# Patient Record
Sex: Female | Born: 1970 | Race: Black or African American | Hispanic: No | Marital: Single | Smoking: Never smoker
Health system: Southern US, Community
[De-identification: ages and names within clinical notes are randomized; demographics above are authoritative.]

## PROBLEM LIST (undated history)

## (undated) DIAGNOSIS — E119 Type 2 diabetes mellitus without complications: Secondary | ICD-10-CM

## (undated) DIAGNOSIS — K219 Gastro-esophageal reflux disease without esophagitis: Secondary | ICD-10-CM

## (undated) DIAGNOSIS — I10 Essential (primary) hypertension: Secondary | ICD-10-CM

## (undated) HISTORY — PX: CHOLECYSTECTOMY: SHX55

## (undated) HISTORY — PX: BREAST BIOPSY: SHX20

## (undated) HISTORY — PX: REDUCTION MAMMAPLASTY: SUR839

## (undated) HISTORY — DX: Essential (primary) hypertension: I10

## (undated) HISTORY — DX: Gastro-esophageal reflux disease without esophagitis: K21.9

## (undated) HISTORY — PX: BREAST EXCISIONAL BIOPSY: SUR124

---

## 1997-07-05 ENCOUNTER — Encounter: Admission: RE | Admit: 1997-07-05 | Discharge: 1997-10-03 | Payer: Self-pay | Admitting: Internal Medicine

## 1998-06-02 ENCOUNTER — Emergency Department (HOSPITAL_COMMUNITY): Admission: EM | Admit: 1998-06-02 | Discharge: 1998-06-02 | Payer: Self-pay | Admitting: Emergency Medicine

## 1999-01-15 HISTORY — PX: BREAST SURGERY: SHX581

## 1999-04-19 ENCOUNTER — Encounter: Admission: RE | Admit: 1999-04-19 | Discharge: 1999-04-19 | Payer: Self-pay | Admitting: Family Medicine

## 1999-04-19 ENCOUNTER — Encounter: Payer: Self-pay | Admitting: Family Medicine

## 1999-07-05 ENCOUNTER — Encounter: Admission: RE | Admit: 1999-07-05 | Discharge: 1999-07-05 | Payer: Self-pay | Admitting: Family Medicine

## 1999-07-05 ENCOUNTER — Encounter: Payer: Self-pay | Admitting: Family Medicine

## 1999-08-02 ENCOUNTER — Other Ambulatory Visit: Admission: RE | Admit: 1999-08-02 | Discharge: 1999-08-02 | Payer: Self-pay | Admitting: Family Medicine

## 2000-02-12 ENCOUNTER — Encounter: Admission: RE | Admit: 2000-02-12 | Discharge: 2000-03-03 | Payer: Self-pay | Admitting: Orthopedic Surgery

## 2000-04-01 ENCOUNTER — Encounter (INDEPENDENT_AMBULATORY_CARE_PROVIDER_SITE_OTHER): Payer: Self-pay | Admitting: Specialist

## 2000-04-01 ENCOUNTER — Other Ambulatory Visit: Admission: RE | Admit: 2000-04-01 | Discharge: 2000-04-01 | Payer: Self-pay | Admitting: Plastic Surgery

## 2001-02-11 ENCOUNTER — Ambulatory Visit (HOSPITAL_BASED_OUTPATIENT_CLINIC_OR_DEPARTMENT_OTHER): Admission: RE | Admit: 2001-02-11 | Discharge: 2001-02-11 | Payer: Self-pay | Admitting: Plastic Surgery

## 2003-09-26 ENCOUNTER — Other Ambulatory Visit: Admission: RE | Admit: 2003-09-26 | Discharge: 2003-09-26 | Payer: Self-pay | Admitting: Family Medicine

## 2004-09-26 ENCOUNTER — Other Ambulatory Visit: Admission: RE | Admit: 2004-09-26 | Discharge: 2004-09-26 | Payer: Self-pay | Admitting: Family Medicine

## 2005-01-01 ENCOUNTER — Encounter: Admission: RE | Admit: 2005-01-01 | Discharge: 2005-04-01 | Payer: Self-pay | Admitting: Family Medicine

## 2005-08-02 ENCOUNTER — Emergency Department (HOSPITAL_COMMUNITY): Admission: EM | Admit: 2005-08-02 | Discharge: 2005-08-02 | Payer: Self-pay | Admitting: Family Medicine

## 2006-01-14 HISTORY — PX: LAPAROSCOPIC GASTRIC BANDING: SHX1100

## 2006-08-12 ENCOUNTER — Encounter: Admission: RE | Admit: 2006-08-12 | Discharge: 2006-08-12 | Payer: Self-pay | Admitting: Family Medicine

## 2006-08-15 ENCOUNTER — Encounter: Admission: RE | Admit: 2006-08-15 | Discharge: 2006-08-15 | Payer: Self-pay | Admitting: Family Medicine

## 2006-08-26 ENCOUNTER — Encounter (INDEPENDENT_AMBULATORY_CARE_PROVIDER_SITE_OTHER): Payer: Self-pay | Admitting: Diagnostic Radiology

## 2006-08-26 ENCOUNTER — Encounter: Admission: RE | Admit: 2006-08-26 | Discharge: 2006-08-26 | Payer: Self-pay | Admitting: Family Medicine

## 2006-09-29 ENCOUNTER — Encounter (INDEPENDENT_AMBULATORY_CARE_PROVIDER_SITE_OTHER): Payer: Self-pay | Admitting: Surgery

## 2006-09-29 ENCOUNTER — Ambulatory Visit (HOSPITAL_BASED_OUTPATIENT_CLINIC_OR_DEPARTMENT_OTHER): Admission: RE | Admit: 2006-09-29 | Discharge: 2006-09-29 | Payer: Self-pay | Admitting: Surgery

## 2006-09-29 ENCOUNTER — Encounter: Admission: RE | Admit: 2006-09-29 | Discharge: 2006-09-29 | Payer: Self-pay | Admitting: Surgery

## 2007-05-28 ENCOUNTER — Other Ambulatory Visit: Admission: RE | Admit: 2007-05-28 | Discharge: 2007-05-28 | Payer: Self-pay | Admitting: Family Medicine

## 2007-08-06 ENCOUNTER — Encounter: Admission: RE | Admit: 2007-08-06 | Discharge: 2007-08-06 | Payer: Self-pay | Admitting: Family Medicine

## 2007-09-20 ENCOUNTER — Emergency Department (HOSPITAL_COMMUNITY): Admission: EM | Admit: 2007-09-20 | Discharge: 2007-09-20 | Payer: Self-pay | Admitting: Emergency Medicine

## 2008-08-11 ENCOUNTER — Encounter: Admission: RE | Admit: 2008-08-11 | Discharge: 2008-08-11 | Payer: Self-pay | Admitting: Family Medicine

## 2008-08-17 ENCOUNTER — Encounter: Admission: RE | Admit: 2008-08-17 | Discharge: 2008-08-17 | Payer: Self-pay | Admitting: Family Medicine

## 2008-08-25 ENCOUNTER — Emergency Department (HOSPITAL_COMMUNITY): Admission: EM | Admit: 2008-08-25 | Discharge: 2008-08-25 | Payer: Self-pay | Admitting: Emergency Medicine

## 2008-09-07 ENCOUNTER — Other Ambulatory Visit: Admission: RE | Admit: 2008-09-07 | Discharge: 2008-09-07 | Payer: Self-pay | Admitting: Family Medicine

## 2009-10-25 ENCOUNTER — Encounter: Admission: RE | Admit: 2009-10-25 | Discharge: 2009-10-25 | Payer: Self-pay | Admitting: Family Medicine

## 2009-12-27 ENCOUNTER — Other Ambulatory Visit
Admission: RE | Admit: 2009-12-27 | Discharge: 2009-12-27 | Payer: Self-pay | Source: Home / Self Care | Admitting: Family Medicine

## 2010-02-05 ENCOUNTER — Encounter: Payer: Self-pay | Admitting: Family Medicine

## 2010-05-29 NOTE — Op Note (Signed)
NAMEKOSHA, JAQUITH            ACCOUNT NO.:  [id number]   MEDICAL RECORD NO.:  [id number]          PATIENT TYPE:  AMB   LOCATION:  DSC                          FACILITY:  [facility]   PHYSICIAN:  Currie Paris, M.D.DATE OF BIRTH:  March 11, 1970   DATE OF PROCEDURE:  09/29/2006  DATE OF DISCHARGE:                               OPERATIVE REPORT   Office record #[id number]   PREOPERATIVE DIAGNOSIS:  Mass, left breast.   POSTOPERATIVE DIAGNOSIS:  Mass, left breast.   OPERATION:  Needle-guided left breast biopsy.   SURGEON:  Currie Paris, M.D.   ANESTHESIA:  General.   CLINICAL HISTORY:  This is a 40 year old lady who is status post  reduction mammoplasties whose recent mammogram showed an abnormality and  MRI guided biopsy was indeterminate and, therefore, excisional biopsy  was recommended.   DESCRIPTION OF PROCEDURE:  The patient was seen in the holding area.  She had no further questions.  We confirmed and marked the left breast  as the operative side.  I reviewed the needle localized mammograms and  the guidewire appeared to go beyond the area in question starting  medially and working laterally almost in a direct horizontal plane.   The patient taken to the operating room and after satisfactory general  anesthesia had been obtained, the left breast was prepped and draped.  The time-out was done.   I made a straight incision starting at the guidewire entry site which  was also the site apparently of her prior biopsy and took a small button  of skin right there and then extended the incision towards the nipple.  I went approximately 6 cm so that I would be beyond the area of a  nodular density at least by the mammogram appearance.  I then raised a  superior flap and then went down to the chest wall.  Then worked around  medially just adjacent to the guidewire so that I was down to the chest  wall medial and I thought that I would then be well medial to the area  in  question, raised inferior flap down again into the chest wall, then  with these three sides freed up and came under, I was able to grasp this  tissue with an Allis, pulled it up into the wound and then excised it on  the nipple side.  As I was  coming down very deep, I found the guidewire  was apparently fully beyond the area in question and I could feel a  small nodular density in the deep superior lateral aspect of the  specimen.  The guidewire as I was pulling on it pulled through since it  was anchored beyond this area.   I sent this for specimen mammography and this subsequently returned and  we appeared to have the area in question.  However, I took some  additional tissue from the superior lateral deep margin to be sure that  we were well around the area that I could feel it was palpably abnormal.  Basically the deep margin was the chest wall.   I raised some deep skin flaps  to make sure I could close a little bit of  the deep breast.  I infiltrated 0.25% plain Marcaine to see if we could  help with postop pain relief.  I then irrigated and carefully achieved  hemostasis with a combination of cautery and suture ligatures of 3-0  Vicryl.   The breast was then closed with 3-0 Vicryl followed by 4-0 Monocryl  subcuticular and Dermabond.   The patient tolerated procedure well and there no operative  complications.  All counts were correct.      Currie Paris, M.D.  Electronically Signed     CJS/MEDQ  D:  09/29/2006  T:  09/30/2006  Job:  [id number]   cc:   Dario Guardian, M.D.

## 2010-06-01 NOTE — Op Note (Signed)
[place]  Patient:    Lauren Hale, Lauren Hale Visit Number: [id number] MRN: [id number]          Service Type: DSU Location: [facility] Attending Physician:  Loura Halt Ii Dictated by:   Alfredia Ferguson, M.D. Proc. Date: 02/11/01 Admit Date:  02/11/2001                             Operative Report  PREOPERATIVE DIAGNOSIS:  Bilateral axillary "dog ears."  POSTOPERATIVE DIAGNOSIS:  Bilateral axillary "dog ears."  OPERATION PERFORMED:  Excision of bilateral axillary dog ears with primary closure.  SURGEON:  Alfredia Ferguson, M.D.  ANESTHESIA:  General endotracheal anesthesia.  INDICATIONS FOR PROCEDURE:  The patient is a 40 year old woman who is status post bilateral breast reduction.  She has been left with bilateral axillary dog ears that she wishes to have removed.  The patient is quite overweight but still the dog ears are bothersome to the inner aspect of her arm when she adducts her arm.  She understands the risk of infection, bleeding, hematoma, unsightly scarring and inability to get rid of all of the dog ears.  In spite of that, she wishes to proceed with surgery.  DESCRIPTION OF PROCEDURE:  With the patient in a standing position, skin markers were placed around each of the two dog ears.  The patient was then placed upon the operating table where she was given general endotracheal anesthesia.  The patients bilateral axillary region was prepped and draped in sterile fashion.  Approximately 15 cc of Marcaine 0.5% 1:200,000 epinephrine was infiltrated in the left side followed by 15% on the right side dog ear. After waiting approximately 5 minutes, attention was first directed to the left side where an elliptical excision of the dog ear was carried out.  The dog ear measured approximately 8 cm in length and approximately 4 to 5 cm in width.  This dog ear was excised, then hemostasis accomplished using electrocautery.  The wound was  copiously irrigated with saline irrigation. After ensuring hemostasis, the wound was closed using multiple interrupted 3-0 Vicryl suture for the dermis followed by a running 3-0 Vicryl subcuticular. Attention was directed to the right side where an identical procedure was performed.  The dimensions of the dog ear on the right was approximately 6 cm x 3 cm.  Upon excision and closure of the wound, Steri-Strips were applied along the skin closure followed by a bulky dressing in the axilla with a 6 inch Ace wrap placed circumferentially around the chest.  The patient tolerated the procedure well.  She had an estimated blood loss of less than 10 cc.  She was awakened, extubated and transported to recovery room in satisfactory condition. Dictated by:   Alfredia Ferguson, M.D. Attending Physician:  Loura Halt Ii DD:  02/11/01 TD:  02/11/01 Job: [id number]

## 2010-06-01 NOTE — Consult Note (Signed)
[facility]  Patient:    Lauren Hale, Lauren Hale                   MRN: [id number] Proc. Date: 02/12/00 Adm. Date:  [id number] Attending:  Nadara Mustard CC:         Dario Guardian, M.D.   Consultation Report  HISTORY OF PRESENT ILLNESS:  The patient is a 40 year old woman with type 2 diabetes and hyperpigmented plane of skin over her midfoot.  The patient presents at this time for evaluation of her diabetic neuropathy and evaluation of her feet.  Diagnosis with type 2 diabetes 3 to 4 years ago.  Most recent hemoglobin A1C 6.2 in July 2001.  PRIMARY CARE PHYSICIAN:  Dr. Merri Brunette.  Height 5 feet 3 inches, weight 230 pounds.  PAST MEDICAL DIAGNOSES: 1. Diabetes. 2. Reflux. 3. Hiatal hernia.  PAST SURGICAL HISTORY: 1. Cholecystectomy. 2. Right leg surgery.  ALLERGIES:  No known drug allergies.  MEDICATIONS:  Amaryl 5 mg p.o. q.d.  PHYSICAL EXAMINATION:  Examination of her both lower extremities does show that she has hyperpigmented skin changes on the plantar aspect of both feet. She does have decreased hair growth.  Has good dorsalis pedis pulses.  Does have protective sensation, can feel 5.07 Sims ______ monofilament.  She has clawing of her toes with prominent metatarsal heads bilaterally with a cavus foot.  There is no bunion deformities.  ASSESSMENT:  Diabetic insensate neuropathy with claw toes and prominent metatarsal head with callus formation and hyperpigmented skin.  PLAN:  We will go ahead and set her up for extra depth shoes, custom inserts, follow up as needed. DD:  02/19/00 TD:  02/20/00 Job: [id number]

## 2010-09-25 ENCOUNTER — Other Ambulatory Visit: Payer: Self-pay | Admitting: Family Medicine

## 2010-09-25 DIAGNOSIS — Z1231 Encounter for screening mammogram for malignant neoplasm of breast: Secondary | ICD-10-CM

## 2010-10-25 LAB — POCT HEMOGLOBIN-HEMACUE: Operator id: 116011

## 2010-10-29 ENCOUNTER — Ambulatory Visit
Admission: RE | Admit: 2010-10-29 | Discharge: 2010-10-29 | Disposition: A | Discharge status: Home / Self Care | Payer: Federal, State, Local not specified - PPO | Source: Ambulatory Visit | Attending: Family Medicine | Admitting: Family Medicine

## 2010-10-29 DIAGNOSIS — Z1231 Encounter for screening mammogram for malignant neoplasm of breast: Secondary | ICD-10-CM

## 2011-10-02 ENCOUNTER — Other Ambulatory Visit: Payer: Self-pay | Admitting: Family Medicine

## 2011-10-02 DIAGNOSIS — Z1231 Encounter for screening mammogram for malignant neoplasm of breast: Secondary | ICD-10-CM

## 2011-11-08 ENCOUNTER — Ambulatory Visit
Admission: RE | Admit: 2011-11-08 | Discharge: 2011-11-08 | Disposition: A | Discharge status: Home / Self Care | Payer: Federal, State, Local not specified - PPO | Source: Ambulatory Visit | Attending: Family Medicine | Admitting: Family Medicine

## 2011-11-08 DIAGNOSIS — Z1231 Encounter for screening mammogram for malignant neoplasm of breast: Secondary | ICD-10-CM

## 2012-01-03 ENCOUNTER — Encounter (HOSPITAL_COMMUNITY): Payer: Self-pay | Admitting: Pharmacist

## 2012-01-06 ENCOUNTER — Other Ambulatory Visit: Payer: Self-pay | Admitting: Obstetrics & Gynecology

## 2012-01-09 ENCOUNTER — Encounter (HOSPITAL_COMMUNITY): Payer: Self-pay

## 2012-01-09 ENCOUNTER — Encounter (HOSPITAL_COMMUNITY)
Admission: RE | Admit: 2012-01-09 | Discharge: 2012-01-09 | Disposition: A | Discharge status: Home / Self Care | Payer: Federal, State, Local not specified - PPO | Source: Ambulatory Visit | Attending: Obstetrics & Gynecology | Admitting: Obstetrics & Gynecology

## 2012-01-09 HISTORY — DX: Type 2 diabetes mellitus without complications: E11.9

## 2012-01-09 LAB — CBC
MCH: 26.8 pg (ref 26.0–34.0)
MCV: 83.1 fL (ref 78.0–100.0)
Platelets: 286 10*3/uL (ref 150–400)
RDW: 15.8 % — ABNORMAL HIGH (ref 11.5–15.5)
WBC: 12.9 10*3/uL — ABNORMAL HIGH (ref 4.0–10.5)

## 2012-01-09 LAB — BASIC METABOLIC PANEL
BUN: 8 mg/dL (ref 6–23)
CO2: 26 mEq/L (ref 19–32)
Chloride: 101 mEq/L (ref 96–112)
Glucose, Bld: 93 mg/dL (ref 70–99)
Potassium: 3.5 mEq/L (ref 3.5–5.1)

## 2012-01-09 LAB — SURGICAL PCR SCREEN: Staphylococcus aureus: NEGATIVE

## 2012-01-09 NOTE — Patient Instructions (Addendum)
[address]  01/09/2012   Your procedure is scheduled on:  01/17/12  Enter through the Main Entrance of [facility] at 1130 AM.  Pick up the phone at the desk and dial [phone].   Call this number if you have problems the morning of surgery: [phone]   Remember:   Do not eat food:After Midnight.  Do not drink clear liquids: 4 Hours before arrival.  Take these medicines the morning of surgery with A SIP OF WATER: NA   Do not wear jewelry, make-up or nail polish.  Do not wear lotions, powders, or perfumes. You may wear deodorant.  Do not shave 48 hours prior to surgery.  Do not bring valuables to the hospital.  Contacts, dentures or bridgework may not be worn into surgery.  Leave suitcase in the car. After surgery it may be brought to your room.  For patients admitted to the hospital, checkout time is 11:00 AM the day of discharge.   Patients discharged the day of surgery will not be allowed to drive home.  Name and phone number of your driver: NA  Special Instructions: Shower using CHG 2 nights before surgery and the night before surgery.  If you shower the day of surgery use CHG.  Use special wash - you have one bottle of CHG for all showers.  You should use approximately 1/3 of the bottle for each shower.   Please read over the following fact sheets that you were given: MRSA Information

## 2012-01-16 MED ORDER — DEXTROSE 5 % IV SOLN
2.0000 g | Freq: Once | INTRAVENOUS | Status: DC
  Filled 2012-01-16: qty 2

## 2012-01-17 ENCOUNTER — Encounter (HOSPITAL_COMMUNITY)
Admission: RE | Disposition: A | Discharge status: Home / Self Care | Payer: Self-pay | Source: Ambulatory Visit | Attending: Obstetrics & Gynecology

## 2012-01-17 ENCOUNTER — Ambulatory Visit (HOSPITAL_COMMUNITY)
Admission: RE | Admit: 2012-01-17 | Discharge: 2012-01-18 | Disposition: A | Discharge status: Home / Self Care | Payer: Federal, State, Local not specified - PPO | Source: Ambulatory Visit | Attending: Obstetrics & Gynecology | Admitting: Obstetrics & Gynecology

## 2012-01-17 ENCOUNTER — Encounter (HOSPITAL_COMMUNITY): Payer: Self-pay

## 2012-01-17 ENCOUNTER — Encounter (HOSPITAL_COMMUNITY): Payer: Self-pay | Admitting: *Deleted

## 2012-01-17 ENCOUNTER — Ambulatory Visit (HOSPITAL_COMMUNITY): Payer: Federal, State, Local not specified - PPO

## 2012-01-17 DIAGNOSIS — Z01812 Encounter for preprocedural laboratory examination: Secondary | ICD-10-CM | POA: Insufficient documentation

## 2012-01-17 DIAGNOSIS — N920 Excessive and frequent menstruation with regular cycle: Secondary | ICD-10-CM | POA: Insufficient documentation

## 2012-01-17 DIAGNOSIS — D259 Leiomyoma of uterus, unspecified: Secondary | ICD-10-CM | POA: Insufficient documentation

## 2012-01-17 DIAGNOSIS — Z01818 Encounter for other preprocedural examination: Secondary | ICD-10-CM | POA: Insufficient documentation

## 2012-01-17 DIAGNOSIS — Z9889 Other specified postprocedural states: Secondary | ICD-10-CM

## 2012-01-17 HISTORY — PX: ROBOT ASSISTED MYOMECTOMY: SHX5142

## 2012-01-17 LAB — CBC
HCT: 41.7 % (ref 36.0–46.0)
MCV: 83.6 fL (ref 78.0–100.0)
RBC: 4.99 MIL/uL (ref 3.87–5.11)
WBC: 12.1 10*3/uL — ABNORMAL HIGH (ref 4.0–10.5)

## 2012-01-17 LAB — ABO/RH: ABO/RH(D): O POS

## 2012-01-17 LAB — TYPE AND SCREEN
ABO/RH(D): O POS
Antibody Screen: NEGATIVE

## 2012-01-17 LAB — GLUCOSE, CAPILLARY: Glucose-Capillary: 195 mg/dL — ABNORMAL HIGH (ref 70–99)

## 2012-01-17 SURGERY — ROBOTIC ASSISTED MYOMECTOMY
Anesthesia: General | Wound class: Clean Contaminated

## 2012-01-17 MED ORDER — FENTANYL CITRATE 0.05 MG/ML IJ SOLN
INTRAMUSCULAR | Status: DC | PRN
  Administered 2012-01-17: 100 ug via INTRAVENOUS
  Administered 2012-01-17 (×2): 50 ug via INTRAVENOUS
  Administered 2012-01-17 (×3): 100 ug via INTRAVENOUS

## 2012-01-17 MED ORDER — BUPIVACAINE HCL (PF) 0.25 % IJ SOLN
INTRAMUSCULAR | Status: AC
  Filled 2012-01-17: qty 30

## 2012-01-17 MED ORDER — FENTANYL CITRATE 0.05 MG/ML IJ SOLN
INTRAMUSCULAR | Status: AC
  Filled 2012-01-17: qty 5

## 2012-01-17 MED ORDER — FENTANYL CITRATE 0.05 MG/ML IJ SOLN
25.0000 ug | INTRAMUSCULAR | Status: DC | PRN
  Administered 2012-01-17: 50 ug via INTRAVENOUS

## 2012-01-17 MED ORDER — FENTANYL CITRATE 0.05 MG/ML IJ SOLN
INTRAMUSCULAR | Status: AC
  Filled 2012-01-17: qty 2

## 2012-01-17 MED ORDER — VASOPRESSIN 20 UNIT/ML IJ SOLN
INTRAMUSCULAR | Status: AC
  Filled 2012-01-17: qty 2

## 2012-01-17 MED ORDER — GLYCOPYRROLATE 0.2 MG/ML IJ SOLN
INTRAMUSCULAR | Status: AC
  Filled 2012-01-17: qty 5

## 2012-01-17 MED ORDER — OXYCODONE-ACETAMINOPHEN 5-325 MG PO TABS
1.0000 | ORAL_TABLET | ORAL | Status: DC | PRN
  Administered 2012-01-18 (×2): 1 via ORAL
  Filled 2012-01-17: qty 1
  Filled 2012-01-17: qty 2

## 2012-01-17 MED ORDER — LIDOCAINE HCL (CARDIAC) 20 MG/ML IV SOLN
INTRAVENOUS | Status: AC
  Filled 2012-01-17: qty 5

## 2012-01-17 MED ORDER — NEOSTIGMINE METHYLSULFATE 1 MG/ML IJ SOLN
INTRAMUSCULAR | Status: AC
  Filled 2012-01-17: qty 1

## 2012-01-17 MED ORDER — MIDAZOLAM HCL 5 MG/5ML IJ SOLN
INTRAMUSCULAR | Status: DC | PRN
  Administered 2012-01-17: 2 mg via INTRAVENOUS

## 2012-01-17 MED ORDER — DEXAMETHASONE SODIUM PHOSPHATE 4 MG/ML IJ SOLN
INTRAMUSCULAR | Status: DC | PRN
  Administered 2012-01-17: 10 mg via INTRAVENOUS

## 2012-01-17 MED ORDER — ARTIFICIAL TEARS OP OINT
TOPICAL_OINTMENT | OPHTHALMIC | Status: AC
  Filled 2012-01-17: qty 3.5

## 2012-01-17 MED ORDER — MIDAZOLAM HCL 2 MG/2ML IJ SOLN
INTRAMUSCULAR | Status: AC
  Filled 2012-01-17: qty 2

## 2012-01-17 MED ORDER — LIDOCAINE HCL (CARDIAC) 20 MG/ML IV SOLN
INTRAVENOUS | Status: DC | PRN
  Administered 2012-01-17: 60 mg via INTRAVENOUS

## 2012-01-17 MED ORDER — HYDROMORPHONE HCL PF 1 MG/ML IJ SOLN
INTRAMUSCULAR | Status: AC
  Filled 2012-01-17: qty 1

## 2012-01-17 MED ORDER — LACTATED RINGERS IV SOLN
INTRAVENOUS | Status: DC
  Administered 2012-01-17 – 2012-01-18 (×2): via INTRAVENOUS

## 2012-01-17 MED ORDER — ROCURONIUM BROMIDE 100 MG/10ML IV SOLN
INTRAVENOUS | Status: DC | PRN
  Administered 2012-01-17: 10 mg via INTRAVENOUS
  Administered 2012-01-17: 20 mg via INTRAVENOUS
  Administered 2012-01-17: 50 mg via INTRAVENOUS
  Administered 2012-01-17: 20 mg via INTRAVENOUS

## 2012-01-17 MED ORDER — PHENYLEPHRINE 40 MCG/ML (10ML) SYRINGE FOR IV PUSH (FOR BLOOD PRESSURE SUPPORT)
PREFILLED_SYRINGE | INTRAVENOUS | Status: AC
  Filled 2012-01-17: qty 10

## 2012-01-17 MED ORDER — RINGERS IRRIGATION IR SOLN
Status: DC | PRN
  Administered 2012-01-17: 1

## 2012-01-17 MED ORDER — SCOPOLAMINE 1 MG/3DAYS TD PT72
MEDICATED_PATCH | TRANSDERMAL | Status: AC
  Administered 2012-01-17: 1.5 mg
  Filled 2012-01-17: qty 1

## 2012-01-17 MED ORDER — ONDANSETRON HCL 4 MG/2ML IJ SOLN
INTRAMUSCULAR | Status: AC
  Filled 2012-01-17: qty 2

## 2012-01-17 MED ORDER — MEPERIDINE HCL 25 MG/ML IJ SOLN: 6.2500 mg | INTRAMUSCULAR | Status: DC | PRN

## 2012-01-17 MED ORDER — PNEUMOCOCCAL VAC POLYVALENT 25 MCG/0.5ML IJ INJ
0.5000 mL | INJECTION | INTRAMUSCULAR | Status: AC
  Administered 2012-01-18: 0.5 mL via INTRAMUSCULAR
  Filled 2012-01-17: qty 0.5

## 2012-01-17 MED ORDER — PROPOFOL 10 MG/ML IV EMUL
INTRAVENOUS | Status: DC | PRN
  Administered 2012-01-17: 200 mg via INTRAVENOUS

## 2012-01-17 MED ORDER — BUPIVACAINE HCL (PF) 0.25 % IJ SOLN
INTRAMUSCULAR | Status: DC | PRN
  Administered 2012-01-17: 17 mL

## 2012-01-17 MED ORDER — NEOSTIGMINE METHYLSULFATE 1 MG/ML IJ SOLN
INTRAMUSCULAR | Status: DC | PRN
  Administered 2012-01-17: 5 mg via INTRAVENOUS

## 2012-01-17 MED ORDER — ROCURONIUM BROMIDE 50 MG/5ML IV SOLN
INTRAVENOUS | Status: AC
  Filled 2012-01-17: qty 1

## 2012-01-17 MED ORDER — PROPOFOL 10 MG/ML IV EMUL: INTRAVENOUS | Status: DC | PRN

## 2012-01-17 MED ORDER — HYDROMORPHONE HCL PF 1 MG/ML IJ SOLN
1.0000 mg | INTRAMUSCULAR | Status: DC | PRN
  Administered 2012-01-17: 1 mg via INTRAVENOUS
  Filled 2012-01-17: qty 1

## 2012-01-17 MED ORDER — VASOPRESSIN 20 UNIT/ML IJ SOLN
INTRAMUSCULAR | Status: DC | PRN
  Administered 2012-01-17: 40 [IU]

## 2012-01-17 MED ORDER — PROPOFOL 10 MG/ML IV EMUL
INTRAVENOUS | Status: AC
  Filled 2012-01-17: qty 20

## 2012-01-17 MED ORDER — GLYCOPYRROLATE 0.2 MG/ML IJ SOLN
INTRAMUSCULAR | Status: DC | PRN
  Administered 2012-01-17: 1 mg via INTRAVENOUS

## 2012-01-17 MED ORDER — LACTATED RINGERS IV SOLN
INTRAVENOUS | Status: DC
  Administered 2012-01-17: 12:00:00 via INTRAVENOUS

## 2012-01-17 MED ORDER — PHENYLEPHRINE HCL 10 MG/ML IJ SOLN
INTRAMUSCULAR | Status: DC | PRN
  Administered 2012-01-17 (×4): .08 mg via INTRAVENOUS
  Administered 2012-01-17: .04 mg via INTRAVENOUS

## 2012-01-17 MED ORDER — HYDROMORPHONE HCL PF 1 MG/ML IJ SOLN
INTRAMUSCULAR | Status: DC | PRN
  Administered 2012-01-17: 1 mg via INTRAVENOUS

## 2012-01-17 MED ORDER — ONDANSETRON HCL 4 MG/2ML IJ SOLN
INTRAMUSCULAR | Status: DC | PRN
  Administered 2012-01-17: 4 mg via INTRAVENOUS

## 2012-01-17 MED ORDER — LACTATED RINGERS IV SOLN
INTRAVENOUS | Status: DC | PRN
  Administered 2012-01-17 (×2): via INTRAVENOUS

## 2012-01-17 MED ORDER — METOCLOPRAMIDE HCL 5 MG/ML IJ SOLN: 10.0000 mg | Freq: Once | INTRAMUSCULAR | Status: DC | PRN

## 2012-01-17 MED ORDER — IBUPROFEN 600 MG PO TABS
600.0000 mg | ORAL_TABLET | Freq: Four times a day (QID) | ORAL | Status: DC | PRN
  Administered 2012-01-18: 600 mg via ORAL
  Filled 2012-01-17: qty 1

## 2012-01-17 MED ORDER — DEXTROSE 5 % IV SOLN
2.0000 g | INTRAVENOUS | Status: DC | PRN
  Administered 2012-01-17: 2 g via INTRAVENOUS

## 2012-01-17 SURGICAL SUPPLY — 63 items
BAG URINE DRAINAGE (UROLOGICAL SUPPLIES) ×2 IMPLANT
BARRIER ADHS 3X4 INTERCEED (GAUZE/BANDAGES/DRESSINGS) ×2 IMPLANT
BLADE LAP MORCELLATOR 15X9.5 (ELECTROSURGICAL) IMPLANT
BLADE LAPAROSCOPIC MORCELL KIT (BLADE) ×2 IMPLANT
CATH FOLEY 2WAY SLVR  5CC 14FR (CATHETERS) ×1
CATH FOLEY 2WAY SLVR 5CC 14FR (CATHETERS) ×1 IMPLANT
CATH FOLEY 3WAY  5CC 16FR (CATHETERS) ×1
CATH FOLEY 3WAY 5CC 16FR (CATHETERS) ×1 IMPLANT
CLOTH BEACON ORANGE TIMEOUT ST (SAFETY) ×2 IMPLANT
DECANTER SPIKE VIAL GLASS SM (MISCELLANEOUS) ×2 IMPLANT
DERMABOND ADVANCED (GAUZE/BANDAGES/DRESSINGS) ×2
DERMABOND ADVANCED .7 DNX12 (GAUZE/BANDAGES/DRESSINGS) ×2 IMPLANT
DRAPE HUG U DISPOSABLE (DRAPE) ×2 IMPLANT
DRAPE LG THREE QUARTER DISP (DRAPES) ×4 IMPLANT
DRAPE WARM FLUID 44X44 (DRAPE) ×2 IMPLANT
ELECT REM PT RETURN 9FT ADLT (ELECTROSURGICAL) ×2
ELECTRODE REM PT RTRN 9FT ADLT (ELECTROSURGICAL) ×1 IMPLANT
EVACUATOR SMOKE 8.L (FILTER) ×2 IMPLANT
GAUZE VASELINE 3X9 (GAUZE/BANDAGES/DRESSINGS) IMPLANT
GLOVE BIO SURGEON STRL SZ 6.5 (GLOVE) ×4 IMPLANT
GLOVE BIOGEL PI IND STRL 7.0 (GLOVE) ×1 IMPLANT
GLOVE BIOGEL PI INDICATOR 7.0 (GLOVE) ×1
GLOVE ECLIPSE 6.5 STRL STRAW (GLOVE) ×6 IMPLANT
GOWN STRL REIN XL XLG (GOWN DISPOSABLE) ×12 IMPLANT
IV SET ADMIN PUMP GEMINI W/NLD (IV SETS) IMPLANT
IV STOPCOCK 4 WAY 40  W/Y SET (IV SOLUTION) ×1
IV STOPCOCK 4 WAY 40 W/Y SET (IV SOLUTION) ×1 IMPLANT
KIT ACCESSORY DA VINCI DISP (KITS) ×1
KIT ACCESSORY DVNC DISP (KITS) ×1 IMPLANT
LEGGING LITHOTOMY PAIR STRL (DRAPES) ×2 IMPLANT
NEEDLE HYPO 22GX1.5 SAFETY (NEEDLE) ×2 IMPLANT
OCCLUDER COLPOPNEUMO (BALLOONS) IMPLANT
PACK LAVH (CUSTOM PROCEDURE TRAY) ×2 IMPLANT
PLUG CATH AND CAP STER (CATHETERS) ×2 IMPLANT
SET CYSTO W/LG BORE CLAMP LF (SET/KITS/TRAYS/PACK) IMPLANT
SET IRRIG TUBING LAPAROSCOPIC (IRRIGATION / IRRIGATOR) ×2 IMPLANT
SOLUTION ELECTROLUBE (MISCELLANEOUS) ×2 IMPLANT
SUT VIC AB 0 CT1 27 (SUTURE)
SUT VIC AB 0 CT1 27XBRD ANTBC (SUTURE) IMPLANT
SUT VIC AB 2-0 CT2 27 (SUTURE) ×2 IMPLANT
SUT VIC AB 4-0 PS2 27 (SUTURE) IMPLANT
SUT VICRYL 0 UR6 27IN ABS (SUTURE) ×4 IMPLANT
SUT VLOC 180 0 6IN GS21 (SUTURE) ×2 IMPLANT
SUT VLOC 180 0 9IN  GS21 (SUTURE) ×3
SUT VLOC 180 0 9IN GS21 (SUTURE) ×3 IMPLANT
SUT VLOC 180 2-0 6IN GS21 (SUTURE) ×4 IMPLANT
SUT VLOC 180 2-0 9IN GS21 (SUTURE) ×6 IMPLANT
SYR 50ML LL SCALE MARK (SYRINGE) ×2 IMPLANT
SYSTEM CONVERTIBLE TROCAR (TROCAR) ×2 IMPLANT
TIP UTERINE 5.1X6CM LAV DISP (MISCELLANEOUS) IMPLANT
TIP UTERINE 6.7X10CM GRN DISP (MISCELLANEOUS) IMPLANT
TIP UTERINE 6.7X6CM WHT DISP (MISCELLANEOUS) IMPLANT
TIP UTERINE 6.7X8CM BLUE DISP (MISCELLANEOUS) ×2 IMPLANT
TOWEL OR 17X24 6PK STRL BLUE (TOWEL DISPOSABLE) ×4 IMPLANT
TRAY FOLEY BAG SILVER LF 14FR (CATHETERS) ×2 IMPLANT
TROCAR 12M 150ML BLUNT (TROCAR) ×2 IMPLANT
TROCAR DISP BLADELESS 8 DVNC (TROCAR) ×1 IMPLANT
TROCAR DISP BLADELESS 8MM (TROCAR) ×1
TROCAR XCEL 12X100 BLDLESS (ENDOMECHANICALS) IMPLANT
TROCAR XCEL NON-BLD 5MMX100MML (ENDOMECHANICALS) ×4 IMPLANT
TROCAR Z-THREAD BLADED 12X100M (TROCAR) IMPLANT
TUBING FILTER THERMOFLATOR (ELECTROSURGICAL) ×2 IMPLANT
WATER STERILE IRR 1000ML POUR (IV SOLUTION) ×6 IMPLANT

## 2012-01-17 NOTE — Op Note (Signed)
01/17/2012  4:56 PM  PATIENT:  Lauren Hale  42 y.o. female  PRE-OPERATIVE DIAGNOSIS:  Menorrhagia; Myomas  POST-OPERATIVE DIAGNOSIS:  Menorrhagia; Myomas  PROCEDURE:  Procedure(s): ROBOTIC ASSISTED MYOMECTOMIES  SURGEON:  Surgeon(s): Genia Del, MD Lenoard Aden, MD  ASSISTANTS: Marlinda Mike   ANESTHESIA:   general  PROCEDURE:  Under general anesthesia with endotracheal intubation. The patient is in lithotomy position. She is prepped with ChloraPrep on the abdomen and with Betadine on the suprapubic, vulvar and vaginal areas.  She is draped as usual. A Foley is put in place in the bladder. The vaginal exam reveals an anteverted uterus, nodular. Adnexae are normal.  The weighted speculum is inserted in the vagina. The anterior lip of the cervix was grasped with a tenaculum. The hysterometry is 9 cm. We the #8 roomy with the medium Coe ring.  This is put in place easily.  Abdominally we infiltrate the subcutaneous tissue with Marcaine one quarter plain at the supraumbilical area.  We make a 1.5 cm incision with a scalpel.  We opened the aponeurosis with Mayo scissors under direct vision. We opened the parietal peritoneum with Metzenbaums scissors under direct vision. A pursestring stitch of Vicryl 0 is used at the aponeurosis. We insert the [place], create a pneumoperitoneum with CO2 and insert the camera.  The uterus presents multiple fibroids, the ovaries are normal to inspection as well as the tubes. No other lesions present in the pelvis. The appendix is normal to inspection. The liver appears normal as well. The catheter are from the lab band is seen. Pictures are taken before myomectomies and after.  We used a semicircular configuration for port placement.  For each port, we infiltrate with Marcaine, make an incision with a scalpel and insert the trocar and under direct vision.  2 robotic ports are placed on the right side, one robotic port on the lower left and the assistant  port a 5 mm trocar on the upper left.  The robot his docked from the right side. The instruments are inserted the Endo Shears scissor in the first arm, the PK on the second arm and the tenaculum on the third arm.  We go to the console.  We infiltrate the myometrium anteriorly with vasopressin.  We then make an incision at that level longitudinally with the tip of the Endo Shears scissor.  Using on the tenaculum and the fibroids the Endo Shears scissor and the PK, we completely extract a large anterior fundal fibroid.  Through the same incision we removed the lower are right anterior fibroid. As well as an upper left anterior fibroid. We then closed that incision with a V. LOC 0, 9 inches suture.  We do a running suture at the level of the myometrium.  We then infiltrates vasopressin posteriorly from the fundus to the the lower aspect of the uterus in the midline.  We make an incision at that level with the tip of the Endo Shears scissor.  We extract clot 3 fibroids are from that incision. We then closed that incision with a V. LOC 0, 9 inches in a running suture.  We then used a V. LOC 20 to close the serosa of both incisions in a baseball stitch.  We used a Vicryl 2-0 to close the smaller incision at the lower anterior right of the uterus.  Hemostasis was adequate at all levels. We therefore removed all robotic instruments. Undocked the robot. And went by laparoscopy. All needles were removed from the  abdomen, using the 8 mm camera. We then inserted on the morcellator through the supraumbilical incision under direct vision. All 6 myomas were morcellated and sent to pathology. We then irrigated the abdominopelvic cavities. Confirmed good hemostasis once more. We used Interceed on the incisions. All laparoscopy instruments were then removed. The CO2 was evacuated. The pursestring stitch at the supraumbilical incision was attached. Hemostasis was completed with the electrocautery when necessary on the incisions. A  Vicryl 4-0 was used in a subcuticular stitch at all incisions.Dermabond was added on all incisions. The instruments were removed from the vagina.  The patient was brought to recovery room in good and stable status.   ESTIMATED BLOOD LOSS: 100 cc   Intake/Output Summary (Last 24 hours) at 01/17/12 1656 Last data filed at 01/17/12 1641  Gross per 24 hour  Intake   1050 ml  Output      0 ml  Net   1050 ml     BLOOD ADMINISTERED:none   LOCAL MEDICATIONS USED:  MARCAINE     SPECIMEN:  Source of Specimen:  Uterine myomas (6)  DISPOSITION OF SPECIMEN:  PATHOLOGY  COUNTS:  YES  PLAN OF CARE: Transfer to PACU    Genia Del MD  01/17/2012  At 4:58 pm

## 2012-01-17 NOTE — Anesthesia Postprocedure Evaluation (Signed)
  Anesthesia Post-op Note  Patient: Lauren Hale  Procedure(s) Performed: Procedure(s) (LRB) with comments: ROBOTIC ASSISTED MYOMECTOMY (N/A)  Patient Location: PACU  Anesthesia Type:General  Level of Consciousness: awake, alert  and oriented  Airway and Oxygen Therapy: Patient Spontanous Breathing  Post-op Pain: none  Post-op Assessment: Post-op Vital signs reviewed, Patient's Cardiovascular Status Stable, Respiratory Function Stable, Patent Airway, No signs of Nausea or vomiting and Pain level controlled  Post-op Vital Signs: Reviewed and stable  Complications: No apparent anesthesia complications

## 2012-01-17 NOTE — H&P (Signed)
Lauren Hale is an 42 y.o. female G0 single  RP: Sxic myomas for [employer]  Pertinent Gynecological History: Menses: flow is excessive with use of many pads or tampons on heaviest days Contraception: Camila Blood transfusions: none Sexually transmitted diseases: no past history Previous GYN Procedures: none Last mammogram: normal Last pap: normal  OB History: G0   Menstrual History:  No LMP recorded.    Past Medical History  Diagnosis Date  . Diabetes mellitus without complication     diet control    Past Surgical History  Procedure Date  . Laparoscopic gastric banding 2008  . Cholecystectomy '90's  . Breast surgery 2001    reduction    No family history on file.  Social History:  reports that she has never smoked. She does not have any smokeless tobacco history on file. She reports that she drinks alcohol. She reports that she does not use illicit drugs.  Allergies: No Known Allergies  Prescriptions prior to admission  Medication Sig Dispense Refill  . fexofenadine (ALLEGRA) 60 MG tablet Take 60 mg by mouth 2 (two) times daily.      . mometasone (ELOCON) 0.1 % cream Apply 1 application topically daily.      . norethindrone (CAMILA) 0.35 MG tablet Take 1 tablet by mouth daily.      . Vitamin D, Ergocalciferol, (DRISDOL) 50000 UNITS CAPS Take 50,000 Units by mouth 2 (two) times a week.         Blood pressure 134/90, pulse 87, temperature 98.1 F (36.7 C), temperature source Oral, resp. rate 16, SpO2 100.00%.  Pelvic US:  4 myomas 2+ to 5+ cm.  Results for orders placed during the hospital encounter of 01/17/12 (from the past 24 hour(s))  PREGNANCY, URINE     Status: Normal   Collection Time   01/17/12 11:45 AM      Component Value Range   Preg Test, Ur NEGATIVE  NEGATIVE  CBC     Status: Abnormal   Collection Time   01/17/12 11:54 AM      Component Value Range   WBC 12.1 (*) 4.0 - 10.5 K/uL   RBC 4.99  3.87 - 5.11 MIL/uL   Hemoglobin 13.6   12.0 - 15.0 g/dL   HCT 14.7  82.9 - 56.2 %   MCV 83.6  78.0 - 100.0 fL   MCH 27.3  26.0 - 34.0 pg   MCHC 32.6  30.0 - 36.0 g/dL   RDW 13.0 (*) 86.5 - 78.4 %   Platelets 298  150 - 400 K/uL  TYPE AND SCREEN     Status: Normal (Preliminary result)   Collection Time   01/17/12 11:54 AM      Component Value Range   ABO/RH(D) O POS     Antibody Screen PENDING     Sample Expiration 01/20/2012    ABO/RH     Status: Normal   Collection Time   01/17/12 11:58 AM      Component Value Range   ABO/RH(D) O POS    GLUCOSE, CAPILLARY     Status: Normal   Collection Time   01/17/12 12:31 PM      Component Value Range   Glucose-Capillary 80  70 - 99 mg/dL    No results found.  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  Surg and risks reviewed.  Meelah Tallo,MARIE-LYNE 01/17/2012, 1:10 PM

## 2012-01-17 NOTE — Anesthesia Preprocedure Evaluation (Signed)
Anesthesia Evaluation  Patient identified by MRN, date of birth, ID band Patient awake    Reviewed: Allergy & Precautions, H&P , NPO status , Patient's Chart, lab work & pertinent test results  Airway Mallampati: III TM Distance: >3 FB Neck ROM: full    Dental No notable dental hx. (+) Teeth Intact   Pulmonary neg pulmonary ROS,  breath sounds clear to auscultation  Pulmonary exam normal       Cardiovascular negative cardio ROS  Rhythm:regular Rate:Normal     Neuro/Psych negative neurological ROS  negative psych ROS   GI/Hepatic negative GI ROS, Neg liver ROS,   Endo/Other  diabetes  Renal/GU negative Renal ROS  negative genitourinary   Musculoskeletal   Abdominal Normal abdominal exam  (+)   Peds  Hematology   Anesthesia Other Findings   Reproductive/Obstetrics negative OB ROS                           Anesthesia Physical Anesthesia Plan  ASA: II  Anesthesia Plan: General ETT   Post-op Pain Management:    Induction:   Airway Management Planned:   Additional Equipment:   Intra-op Plan:   Post-operative Plan:   Informed Consent: I have reviewed the patients History and Physical, chart, labs and discussed the procedure including the risks, benefits and alternatives for the proposed anesthesia with the patient or authorized representative who has indicated his/her understanding and acceptance.   Dental Advisory Given  Plan Discussed with: Anesthesiologist, CRNA and Surgeon  Anesthesia Plan Comments:         Anesthesia Quick Evaluation

## 2012-01-17 NOTE — Anesthesia Procedure Notes (Signed)
Procedure Name: Intubation Date/Time: 01/17/2012 1:27 PM Performed by: Shanon Payor Pre-anesthesia Checklist: Patient identified, Emergency Drugs available, Suction available, Patient being monitored and Timeout performed Patient Re-evaluated:Patient Re-evaluated prior to inductionOxygen Delivery Method: Circle system utilized Preoxygenation: Pre-oxygenation with 100% oxygen Intubation Type: IV induction Ventilation: Mask ventilation without difficulty Laryngoscope Size: Mac and 3 Grade View: Grade I Tube type: Oral Tube size: 7.0 mm Number of attempts: 1 Placement Confirmation: ETT inserted through vocal cords under direct vision,  positive ETCO2 and breath sounds checked- equal and bilateral Secured at: 21 cm Tube secured with: Tape Dental Injury: Teeth and Oropharynx as per pre-operative assessment

## 2012-01-17 NOTE — Transfer of Care (Signed)
Immediate Anesthesia Transfer of Care Note  Patient: Lauren Hale  Procedure(s) Performed: Procedure(s) (LRB) with comments: ROBOTIC ASSISTED MYOMECTOMY (N/A)  Patient Location: PACU  Anesthesia Type:General  Level of Consciousness: awake, alert  and oriented  Airway & Oxygen Therapy: Patient Spontanous Breathing and Patient connected to nasal cannula oxygen  Post-op Assessment: Report given to PACU RN and Post -op Vital signs reviewed and stable  Post vital signs: Reviewed and stable  Complications: No apparent anesthesia complications

## 2012-01-18 DIAGNOSIS — Z9889 Other specified postprocedural states: Secondary | ICD-10-CM

## 2012-01-18 LAB — CBC
Hemoglobin: 10.4 g/dL — ABNORMAL LOW (ref 12.0–15.0)
MCH: 26.4 pg (ref 26.0–34.0)
MCHC: 32.1 g/dL (ref 30.0–36.0)
Platelets: 241 10*3/uL (ref 150–400)
RBC: 3.94 MIL/uL (ref 3.87–5.11)

## 2012-01-18 LAB — GLUCOSE, CAPILLARY: Glucose-Capillary: 154 mg/dL — ABNORMAL HIGH (ref 70–99)

## 2012-01-18 MED ORDER — OXYCODONE-ACETAMINOPHEN 7.5-325 MG PO TABS: 1.0000 | ORAL_TABLET | ORAL | Status: DC | PRN

## 2012-01-18 NOTE — Discharge Summary (Signed)
  Physician Discharge Summary  Patient ID: Lauren Hale MRN: [id number] DOB/AGE: 17-Oct-1970 42 y.o.  Admit date: 01/17/2012 Discharge date: 01/18/2012  Admission Diagnoses: Menorrhagia, Myomas  Discharge Diagnoses: Menorrhagia, Myomas        Active Problems:  Status post myomectomy   Discharged Condition: good  Hospital Course: Observation  Consults: None  Treatments: surgery: Myomectomies assisted with da Vinci robot  Disposition: Final discharge disposition not confirmed     Medication List     As of 01/18/2012 10:01 AM    TAKE these medications         CAMILA 0.35 MG tablet   Generic drug: norethindrone   Take 1 tablet by mouth daily.      fexofenadine 60 MG tablet   Commonly known as: ALLEGRA   Take 60 mg by mouth 2 (two) times daily.      mometasone 0.1 % cream   Commonly known as: ELOCON   Apply 1 application topically daily.      oxyCODONE-acetaminophen 7.5-325 MG per tablet   Commonly known as: PERCOCET   Take 1 tablet by mouth every 4 (four) hours as needed for pain.      Vitamin D (Ergocalciferol) 50000 UNITS Caps   Commonly known as: DRISDOL   Take 50,000 Units by mouth 2 (two) times a week.           Follow-up Information    Follow up with Carlin Mamone,MARIE-LYNE, MD. In 3 weeks.   Contact information:   [address] [place] [address] [phone]          Signed: Genia Del, MD 01/18/2012, 10:01 AM

## 2012-01-18 NOTE — Progress Notes (Signed)
Discharge instructions given to patient and family at bedside.  Activity, medications, follow up appointments and community resources discussed.  No questions at this time.  Patient left unit in wheelchair in stable condition with personal belongings.  Osvaldo Angst, RN------------------

## 2012-01-18 NOTE — Addendum Note (Signed)
Addendum  created 01/18/12 1610 by Algis Greenhouse, CRNA   Modules edited:Notes Section

## 2012-01-18 NOTE — Anesthesia Postprocedure Evaluation (Signed)
Anesthesia Post Note  Patient: Lauren Hale  Procedure(s) Performed: Procedure(s) (LRB): ROBOTIC ASSISTED MYOMECTOMY (N/A)  Anesthesia type: General  Patient location: [facility] Unit  Post pain: Pain level controlled  Post assessment: Post-op Vital signs reviewed  Last Vitals:  Filed Vitals:   01/18/12 0521  BP: 138/84  Pulse: 86  Temp: 36.8 C  Resp: 18    Post vital signs: Reviewed  Level of consciousness: sedated  Complications: No apparent anesthesia complications

## 2012-01-18 NOTE — Progress Notes (Signed)
1 Day Post-Op Procedure(s) (LRB): ROBOTIC ASSISTED MYOMECTOMY (N/A)  Subjective: Patient reports that pain is well managed.  Tolerating normal diet as tolerated  diet without difficulty. No nausea / vomiting.  Ambulating and voiding.  Objective: BP 138/84  Pulse 86  Temp 98.2 F (36.8 C) (Oral)  Resp 18  Ht 5\' 3"  (1.6 m)  Wt 89.614 kg (197 lb 9 oz)  BMI 35.00 kg/m2  SpO2 100% Lungs: clear Heart: normal rate and rhythm Abdomen:soft and appropriately tender Extremities: Homans sign is negative, no sign of DVT Incision: healing well  Hgb 10.4 WBC 22.9   Assessment: s/p Procedure(s): ROBOTIC ASSISTED MYOMECTOMY: progressing well  Plan: Discharge home  LOS: 1 day    Heith Haigler,MARIE-LYNE 01/18/2012, 9:55 AM

## 2012-01-20 ENCOUNTER — Encounter (HOSPITAL_COMMUNITY): Payer: Self-pay | Admitting: Obstetrics & Gynecology

## 2012-04-23 ENCOUNTER — Other Ambulatory Visit: Payer: Self-pay

## 2012-10-14 ENCOUNTER — Other Ambulatory Visit: Payer: Self-pay

## 2012-10-14 DIAGNOSIS — Z1231 Encounter for screening mammogram for malignant neoplasm of breast: Secondary | ICD-10-CM

## 2012-11-13 ENCOUNTER — Ambulatory Visit
Admission: RE | Admit: 2012-11-13 | Discharge: 2012-11-13 | Disposition: A | Discharge status: Home / Self Care | Payer: Federal, State, Local not specified - PPO | Source: Ambulatory Visit

## 2012-11-13 DIAGNOSIS — Z1231 Encounter for screening mammogram for malignant neoplasm of breast: Secondary | ICD-10-CM

## 2015-05-04 DIAGNOSIS — I10 Essential (primary) hypertension: Secondary | ICD-10-CM | POA: Diagnosis not present

## 2015-05-04 DIAGNOSIS — E119 Type 2 diabetes mellitus without complications: Secondary | ICD-10-CM | POA: Diagnosis not present

## 2015-05-16 DIAGNOSIS — K080 Exfoliation of teeth due to systemic causes: Secondary | ICD-10-CM | POA: Diagnosis not present

## 2015-06-03 DIAGNOSIS — J208 Acute bronchitis due to other specified organisms: Secondary | ICD-10-CM | POA: Diagnosis not present

## 2015-06-03 DIAGNOSIS — B9689 Other specified bacterial agents as the cause of diseases classified elsewhere: Secondary | ICD-10-CM | POA: Diagnosis not present

## 2015-06-03 DIAGNOSIS — J019 Acute sinusitis, unspecified: Secondary | ICD-10-CM | POA: Diagnosis not present

## 2015-08-21 DIAGNOSIS — M25572 Pain in left ankle and joints of left foot: Secondary | ICD-10-CM | POA: Diagnosis not present

## 2015-09-01 DIAGNOSIS — Z9884 Bariatric surgery status: Secondary | ICD-10-CM | POA: Diagnosis not present

## 2015-09-01 DIAGNOSIS — Z6839 Body mass index (BMI) 39.0-39.9, adult: Secondary | ICD-10-CM | POA: Diagnosis not present

## 2015-09-08 DIAGNOSIS — F4323 Adjustment disorder with mixed anxiety and depressed mood: Secondary | ICD-10-CM | POA: Diagnosis not present

## 2015-09-14 DIAGNOSIS — M7662 Achilles tendinitis, left leg: Secondary | ICD-10-CM | POA: Diagnosis not present

## 2015-09-15 DIAGNOSIS — Z713 Dietary counseling and surveillance: Secondary | ICD-10-CM | POA: Diagnosis not present

## 2015-09-25 DIAGNOSIS — F4323 Adjustment disorder with mixed anxiety and depressed mood: Secondary | ICD-10-CM | POA: Diagnosis not present

## 2015-10-06 DIAGNOSIS — M25572 Pain in left ankle and joints of left foot: Secondary | ICD-10-CM | POA: Diagnosis not present

## 2015-10-06 DIAGNOSIS — M7662 Achilles tendinitis, left leg: Secondary | ICD-10-CM | POA: Diagnosis not present

## 2015-10-09 DIAGNOSIS — F4323 Adjustment disorder with mixed anxiety and depressed mood: Secondary | ICD-10-CM | POA: Diagnosis not present

## 2015-10-13 DIAGNOSIS — M25572 Pain in left ankle and joints of left foot: Secondary | ICD-10-CM | POA: Diagnosis not present

## 2015-10-13 DIAGNOSIS — M7662 Achilles tendinitis, left leg: Secondary | ICD-10-CM | POA: Diagnosis not present

## 2015-10-20 DIAGNOSIS — M7662 Achilles tendinitis, left leg: Secondary | ICD-10-CM | POA: Diagnosis not present

## 2015-10-20 DIAGNOSIS — M25572 Pain in left ankle and joints of left foot: Secondary | ICD-10-CM | POA: Diagnosis not present

## 2015-10-23 DIAGNOSIS — E119 Type 2 diabetes mellitus without complications: Secondary | ICD-10-CM | POA: Diagnosis not present

## 2015-10-23 DIAGNOSIS — I10 Essential (primary) hypertension: Secondary | ICD-10-CM | POA: Diagnosis not present

## 2015-10-23 DIAGNOSIS — E669 Obesity, unspecified: Secondary | ICD-10-CM | POA: Diagnosis not present

## 2015-10-23 DIAGNOSIS — F4323 Adjustment disorder with mixed anxiety and depressed mood: Secondary | ICD-10-CM | POA: Diagnosis not present

## 2015-10-23 DIAGNOSIS — E782 Mixed hyperlipidemia: Secondary | ICD-10-CM | POA: Diagnosis not present

## 2015-11-03 DIAGNOSIS — M7662 Achilles tendinitis, left leg: Secondary | ICD-10-CM | POA: Diagnosis not present

## 2015-11-03 DIAGNOSIS — M25572 Pain in left ankle and joints of left foot: Secondary | ICD-10-CM | POA: Diagnosis not present

## 2015-11-07 DIAGNOSIS — F4323 Adjustment disorder with mixed anxiety and depressed mood: Secondary | ICD-10-CM | POA: Diagnosis not present

## 2015-11-17 DIAGNOSIS — F4323 Adjustment disorder with mixed anxiety and depressed mood: Secondary | ICD-10-CM | POA: Diagnosis not present

## 2015-11-24 DIAGNOSIS — M7662 Achilles tendinitis, left leg: Secondary | ICD-10-CM | POA: Diagnosis not present

## 2015-11-24 DIAGNOSIS — M25572 Pain in left ankle and joints of left foot: Secondary | ICD-10-CM | POA: Diagnosis not present

## 2015-12-04 DIAGNOSIS — K080 Exfoliation of teeth due to systemic causes: Secondary | ICD-10-CM | POA: Diagnosis not present

## 2016-01-16 DIAGNOSIS — F4323 Adjustment disorder with mixed anxiety and depressed mood: Secondary | ICD-10-CM | POA: Diagnosis not present

## 2016-02-08 DIAGNOSIS — J069 Acute upper respiratory infection, unspecified: Secondary | ICD-10-CM | POA: Diagnosis not present

## 2016-02-16 DIAGNOSIS — F4323 Adjustment disorder with mixed anxiety and depressed mood: Secondary | ICD-10-CM | POA: Diagnosis not present

## 2016-03-01 DIAGNOSIS — Z1231 Encounter for screening mammogram for malignant neoplasm of breast: Secondary | ICD-10-CM | POA: Diagnosis not present

## 2016-03-01 DIAGNOSIS — M5416 Radiculopathy, lumbar region: Secondary | ICD-10-CM | POA: Diagnosis not present

## 2016-03-01 DIAGNOSIS — Z6837 Body mass index (BMI) 37.0-37.9, adult: Secondary | ICD-10-CM | POA: Diagnosis not present

## 2016-03-01 DIAGNOSIS — Z1151 Encounter for screening for human papillomavirus (HPV): Secondary | ICD-10-CM | POA: Diagnosis not present

## 2016-03-01 DIAGNOSIS — Z01419 Encounter for gynecological examination (general) (routine) without abnormal findings: Secondary | ICD-10-CM | POA: Diagnosis not present

## 2016-03-04 DIAGNOSIS — F4323 Adjustment disorder with mixed anxiety and depressed mood: Secondary | ICD-10-CM | POA: Diagnosis not present

## 2016-03-15 DIAGNOSIS — M7061 Trochanteric bursitis, right hip: Secondary | ICD-10-CM | POA: Diagnosis not present

## 2016-03-15 DIAGNOSIS — M25551 Pain in right hip: Secondary | ICD-10-CM | POA: Diagnosis not present

## 2016-03-22 DIAGNOSIS — F4323 Adjustment disorder with mixed anxiety and depressed mood: Secondary | ICD-10-CM | POA: Diagnosis not present

## 2016-03-26 DIAGNOSIS — M7061 Trochanteric bursitis, right hip: Secondary | ICD-10-CM | POA: Diagnosis not present

## 2016-03-26 DIAGNOSIS — M25551 Pain in right hip: Secondary | ICD-10-CM | POA: Diagnosis not present

## 2016-04-02 DIAGNOSIS — M25551 Pain in right hip: Secondary | ICD-10-CM | POA: Diagnosis not present

## 2016-04-02 DIAGNOSIS — M7061 Trochanteric bursitis, right hip: Secondary | ICD-10-CM | POA: Diagnosis not present

## 2016-04-05 DIAGNOSIS — M25551 Pain in right hip: Secondary | ICD-10-CM | POA: Diagnosis not present

## 2016-04-05 DIAGNOSIS — M7061 Trochanteric bursitis, right hip: Secondary | ICD-10-CM | POA: Diagnosis not present

## 2016-04-09 DIAGNOSIS — K625 Hemorrhage of anus and rectum: Secondary | ICD-10-CM | POA: Diagnosis not present

## 2016-04-09 DIAGNOSIS — K5904 Chronic idiopathic constipation: Secondary | ICD-10-CM | POA: Diagnosis not present

## 2016-04-09 DIAGNOSIS — M7061 Trochanteric bursitis, right hip: Secondary | ICD-10-CM | POA: Diagnosis not present

## 2016-04-09 DIAGNOSIS — Z8371 Family history of colonic polyps: Secondary | ICD-10-CM | POA: Diagnosis not present

## 2016-04-09 DIAGNOSIS — M25551 Pain in right hip: Secondary | ICD-10-CM | POA: Diagnosis not present

## 2016-04-09 DIAGNOSIS — Z800 Family history of malignant neoplasm of digestive organs: Secondary | ICD-10-CM | POA: Diagnosis not present

## 2016-04-11 DIAGNOSIS — Z800 Family history of malignant neoplasm of digestive organs: Secondary | ICD-10-CM | POA: Diagnosis not present

## 2016-04-11 DIAGNOSIS — K625 Hemorrhage of anus and rectum: Secondary | ICD-10-CM | POA: Diagnosis not present

## 2016-04-11 DIAGNOSIS — K5904 Chronic idiopathic constipation: Secondary | ICD-10-CM | POA: Diagnosis not present

## 2016-04-11 DIAGNOSIS — Z8371 Family history of colonic polyps: Secondary | ICD-10-CM | POA: Diagnosis not present

## 2016-04-19 DIAGNOSIS — F4323 Adjustment disorder with mixed anxiety and depressed mood: Secondary | ICD-10-CM | POA: Diagnosis not present

## 2016-04-22 DIAGNOSIS — Z1211 Encounter for screening for malignant neoplasm of colon: Secondary | ICD-10-CM | POA: Diagnosis not present

## 2016-04-22 DIAGNOSIS — Z8371 Family history of colonic polyps: Secondary | ICD-10-CM | POA: Diagnosis not present

## 2016-04-22 DIAGNOSIS — K625 Hemorrhage of anus and rectum: Secondary | ICD-10-CM | POA: Diagnosis not present

## 2016-04-22 DIAGNOSIS — Z800 Family history of malignant neoplasm of digestive organs: Secondary | ICD-10-CM | POA: Diagnosis not present

## 2016-04-29 DIAGNOSIS — E669 Obesity, unspecified: Secondary | ICD-10-CM | POA: Diagnosis not present

## 2016-04-29 DIAGNOSIS — E559 Vitamin D deficiency, unspecified: Secondary | ICD-10-CM | POA: Diagnosis not present

## 2016-04-29 DIAGNOSIS — I10 Essential (primary) hypertension: Secondary | ICD-10-CM | POA: Diagnosis not present

## 2016-04-29 DIAGNOSIS — E119 Type 2 diabetes mellitus without complications: Secondary | ICD-10-CM | POA: Diagnosis not present

## 2016-04-29 DIAGNOSIS — Z0000 Encounter for general adult medical examination without abnormal findings: Secondary | ICD-10-CM | POA: Diagnosis not present

## 2016-05-01 DIAGNOSIS — F4323 Adjustment disorder with mixed anxiety and depressed mood: Secondary | ICD-10-CM | POA: Diagnosis not present

## 2016-05-14 DIAGNOSIS — F4323 Adjustment disorder with mixed anxiety and depressed mood: Secondary | ICD-10-CM | POA: Diagnosis not present

## 2016-05-15 DIAGNOSIS — S92354A Nondisplaced fracture of fifth metatarsal bone, right foot, initial encounter for closed fracture: Secondary | ICD-10-CM | POA: Diagnosis not present

## 2016-05-31 DIAGNOSIS — S92354A Nondisplaced fracture of fifth metatarsal bone, right foot, initial encounter for closed fracture: Secondary | ICD-10-CM | POA: Diagnosis not present

## 2016-06-03 DIAGNOSIS — K080 Exfoliation of teeth due to systemic causes: Secondary | ICD-10-CM | POA: Diagnosis not present

## 2016-06-14 DIAGNOSIS — S92354A Nondisplaced fracture of fifth metatarsal bone, right foot, initial encounter for closed fracture: Secondary | ICD-10-CM | POA: Diagnosis not present

## 2016-07-08 DIAGNOSIS — S92354D Nondisplaced fracture of fifth metatarsal bone, right foot, subsequent encounter for fracture with routine healing: Secondary | ICD-10-CM | POA: Diagnosis not present

## 2016-07-12 DIAGNOSIS — D72829 Elevated white blood cell count, unspecified: Secondary | ICD-10-CM | POA: Diagnosis not present

## 2016-07-22 DIAGNOSIS — S92354D Nondisplaced fracture of fifth metatarsal bone, right foot, subsequent encounter for fracture with routine healing: Secondary | ICD-10-CM | POA: Diagnosis not present

## 2016-07-25 DIAGNOSIS — Z13820 Encounter for screening for osteoporosis: Secondary | ICD-10-CM | POA: Diagnosis not present

## 2016-07-26 ENCOUNTER — Encounter: Payer: Self-pay | Admitting: Hematology and Oncology

## 2016-07-26 ENCOUNTER — Telehealth: Payer: Self-pay | Admitting: Hematology and Oncology

## 2016-07-26 NOTE — Telephone Encounter (Signed)
Appt has been scheduled for the pt to see Dr. Alvy Bimler on 8/10 at 2pm. Pt aware to arrive 30 minutes early. Letter mailed.

## 2016-08-05 DIAGNOSIS — S92354D Nondisplaced fracture of fifth metatarsal bone, right foot, subsequent encounter for fracture with routine healing: Secondary | ICD-10-CM | POA: Diagnosis not present

## 2016-08-23 ENCOUNTER — Encounter: Payer: Self-pay | Admitting: Hematology and Oncology

## 2016-08-23 ENCOUNTER — Ambulatory Visit (HOSPITAL_BASED_OUTPATIENT_CLINIC_OR_DEPARTMENT_OTHER): Payer: Federal, State, Local not specified - PPO

## 2016-08-23 ENCOUNTER — Telehealth: Payer: Self-pay | Admitting: Hematology and Oncology

## 2016-08-23 ENCOUNTER — Ambulatory Visit (HOSPITAL_BASED_OUTPATIENT_CLINIC_OR_DEPARTMENT_OTHER): Payer: Federal, State, Local not specified - PPO | Admitting: Hematology and Oncology

## 2016-08-23 DIAGNOSIS — D72829 Elevated white blood cell count, unspecified: Secondary | ICD-10-CM | POA: Insufficient documentation

## 2016-08-23 DIAGNOSIS — D72825 Bandemia: Secondary | ICD-10-CM

## 2016-08-23 DIAGNOSIS — R21 Rash and other nonspecific skin eruption: Secondary | ICD-10-CM | POA: Diagnosis not present

## 2016-08-23 DIAGNOSIS — M7071 Other bursitis of hip, right hip: Secondary | ICD-10-CM

## 2016-08-23 LAB — CBC WITH DIFFERENTIAL/PLATELET
BASO%: 0.2 % (ref 0.0–2.0)
Basophils Absolute: 0 10*3/uL (ref 0.0–0.1)
EOS ABS: 0.4 10*3/uL (ref 0.0–0.5)
EOS%: 3.4 % (ref 0.0–7.0)
HEMATOCRIT: 35.1 % (ref 34.8–46.6)
HGB: 11.4 g/dL — ABNORMAL LOW (ref 11.6–15.9)
LYMPH#: 3 10*3/uL (ref 0.9–3.3)
LYMPH%: 23 % (ref 14.0–49.7)
MCH: 27.3 pg (ref 25.1–34.0)
MCHC: 32.5 g/dL (ref 31.5–36.0)
MCV: 84.2 fL (ref 79.5–101.0)
MONO#: 0.6 10*3/uL (ref 0.1–0.9)
MONO%: 4.7 % (ref 0.0–14.0)
NEUT%: 68.7 % (ref 38.4–76.8)
NEUTROS ABS: 9 10*3/uL — AB (ref 1.5–6.5)
PLATELETS: 271 10*3/uL (ref 145–400)
RBC: 4.17 10*6/uL (ref 3.70–5.45)
RDW: 15.1 % — AB (ref 11.2–14.5)
WBC: 13.1 10*3/uL — AB (ref 3.9–10.3)

## 2016-08-23 LAB — URINALYSIS, MICROSCOPIC - CHCC
Bilirubin (Urine): NEGATIVE
Blood: NEGATIVE
Glucose: NEGATIVE mg/dL
Ketones: NEGATIVE mg/dL
Leukocyte Esterase: NEGATIVE
NITRITE: NEGATIVE
PROTEIN: NEGATIVE mg/dL
SPECIFIC GRAVITY, URINE: 1.03 (ref 1.003–1.035)
Urobilinogen, UR: 0.2 mg/dL (ref 0.2–1)
pH: 6 (ref 4.6–8.0)

## 2016-08-23 NOTE — Assessment & Plan Note (Signed)
The skin rashes due to poorly controlled diabetes She is managing it conservatively with topical nystatin powder We discussed importance of getting her diabetes under good control

## 2016-08-23 NOTE — Progress Notes (Signed)
[place] CONSULT NOTE  Patient Care Team: Carol Ada, MD as PCP - General (Family Medicine)  CHIEF COMPLAINTS/PURPOSE OF CONSULTATION:  Chronic leukocytosis  HISTORY OF PRESENTING ILLNESS:  Lauren Hale 46 y.o. female is here because of elevated WBC.  She was found to have abnormal CBC from routine blood work monitoring.  Her CBC had been abnormal since 2013.  I received copies of her blood work from her primary care doctor.  Once, her WBC was within normal limits in 2016.  It went as high as 22,000 after her myomectomy in 2014. She denies recent infection. The last prescription antibiotics was more than 3 months ago She does have chronic intermittent skin rash under the skin fold secondary to obesity.  She put nystatin powder on those areas There is not reported symptoms of sinus congestion, cough, urinary frequency/urgency or dysuria, diarrhea or dental issues.  She does have diagnosis of right hip bursitis and had injection to the right hip many years ago. She had no prior history or diagnosis of cancer. Her age appropriate screening programs are up-to-date. The patient has no prior diagnosis of autoimmune disease and was not prescribed corticosteroids related products.  The patient is not a smoker.  MEDICAL HISTORY:  Past Medical History:  Diagnosis Date  . Diabetes mellitus without complication (HCC)    diet control  . GERD (gastroesophageal reflux disease)   . Hypertension     SURGICAL HISTORY: Past Surgical History:  Procedure Laterality Date  . BREAST SURGERY  2001   reduction  . CHOLECYSTECTOMY  '90's  . LAPAROSCOPIC GASTRIC BANDING  2008  . ROBOT ASSISTED MYOMECTOMY  01/17/2012   Procedure: ROBOTIC ASSISTED MYOMECTOMY;  Surgeon: Princess Bruins, MD;  Location: [facility];  Service: Gynecology;  Laterality: N/A;    SOCIAL HISTORY: Social History   Social History  . Marital status: Single    Spouse name: N/A  . Number of children: 0  .  Years of education: N/A   Occupational History  . supervisor [employer]    Social History Main Topics  . Smoking status: Never Smoker  . Smokeless tobacco: Never Used  . Alcohol use Yes     Comment: occasionally  . Drug use: No  . Sexual activity: Not on file   Other Topics Concern  . Not on file   Social History Narrative  . No narrative on file    FAMILY HISTORY: Family History  Problem Relation Age of Onset  . Cancer Sister 24       breast ca, neg testing  . Cancer Maternal Uncle        throat ca  . Cancer Paternal Aunt        stomach ca  . Cancer Paternal Uncle        colon ca  . Cancer Cousin        stomach ca  . Cancer Paternal Aunt        breast ca    ALLERGIES:  has No Known Allergies.  MEDICATIONS:  Current Outpatient Prescriptions  Medication Sig Dispense Refill  . cetirizine (ZYRTEC) 10 MG tablet Take 10 mg by mouth.    . [employer] D3 5000 units capsule Take 5,000 Units by mouth daily.  1  . liraglutide (VICTOZA) 18 MG/3ML SOPN Victoza 3-Pak 0.6 mg/0.1 mL (18 mg/3 mL) subcutaneous pen injector    . losartan (COZAAR) 100 MG tablet losartan 100 mg tablet    . meloxicam (MOBIC) 15 MG tablet meloxicam 15  mg tablet    . EPINEPHrine (EPIPEN 2-PAK) 0.3 mg/0.3 mL IJ SOAJ injection      No current facility-administered medications for this visit.     REVIEW OF SYSTEMS:   Constitutional: Denies fevers, chills or abnormal night sweats Eyes: Denies blurriness of vision, double vision or watery eyes Ears, nose, mouth, throat, and face: Denies mucositis or sore throat Respiratory: Denies cough, dyspnea or wheezes Cardiovascular: Denies palpitation, chest discomfort or lower extremity swelling Gastrointestinal:  Denies nausea, heartburn or change in bowel habits Lymphatics: Denies new lymphadenopathy or easy bruising Neurological:Denies numbness, tingling or new weaknesses Behavioral/Psych: Mood is stable, no new changes  All other systems were reviewed with the patient  and are negative.  PHYSICAL EXAMINATION: ECOG PERFORMANCE STATUS: 0 - Asymptomatic  Vitals:   08/23/16 1338  BP: (!) 155/75  Pulse: 81  Resp: 18  Temp: 98.5 F (36.9 C)  SpO2: 100%   Filed Weights   08/23/16 1338  Weight: 218 lb 8 oz (99.1 kg)    GENERAL:alert, no distress and comfortable.  She is morbidly obese SKIN: Noted skin rash under skin folds consistent with yeast infection EYES: normal, conjunctiva are pink and non-injected, sclera clear OROPHARYNX:no exudate, no erythema and lips, buccal mucosa, and tongue normal  NECK: supple, thyroid normal size, non-tender, without nodularity LYMPH:  no palpable lymphadenopathy in the cervical, axillary or inguinal LUNGS: clear to auscultation and percussion with normal breathing effort HEART: regular rate & rhythm and no murmurs and no lower extremity edema ABDOMEN:abdomen soft, non-tender and normal bowel sounds Musculoskeletal:no cyanosis of digits and no clubbing  PSYCH: alert & oriented x 3 with fluent speech NEURO: no focal motor/sensory deficits  LABORATORY DATA:  I have reviewed the data as listed Lab Results  Component Value Date   WBC 22.9 (H) 01/18/2012   HGB 10.4 (L) 01/18/2012   HCT 32.4 (L) 01/18/2012   MCV 82.2 01/18/2012   PLT 241 01/18/2012    ASSESSMENT & PLAN  Leukocytosis Overall, I felt that the leukocytosis is likely due to reactive process She has intermittent normal WBC in 2016 The most likely source of reactive leukocytosis is her skin or possibly from inflammatory joint secondary to bursitis I will order some basic workup to rule out autoimmune disorder, urinary tract infection and to repeat CBC I will call with test results If test results confirm reactive process, she does not need long-term follow-up  Bursitis of right hip She has intermittent bursitis of the right hip I will order inflammatory markers to assess  Skin rash The skin rashes due to poorly controlled diabetes She is  managing it conservatively with topical nystatin powder We discussed importance of getting her diabetes under good control   Orders Placed This Encounter  Procedures  . Urine Culture    Standing Status:   Future    Number of Occurrences:   1    Standing Expiration Date:   09/27/2017  . CBC with Differential/Platelet    Standing Status:   Future    Number of Occurrences:   1    Standing Expiration Date:   09/27/2017  . Urinalysis, Microscopic - [facility]    Standing Status:   Future    Number of Occurrences:   1    Standing Expiration Date:   09/27/2017  . Sedimentation rate    Standing Status:   Future    Number of Occurrences:   1    Standing Expiration Date:   09/27/2017  . C-reactive  protein    Standing Status:   Future    Number of Occurrences:   1    Standing Expiration Date:   09/27/2017  . ANA, IFA (with reflex)    Standing Status:   Future    Number of Occurrences:   1    Standing Expiration Date:   09/27/2017    All questions were answered. The patient knows to call the clinic with any problems, questions or concerns. I spent 30 minutes counseling the patient face to face. The total time spent in the appointment was 45 minutes and more than 50% was on counseling.     Heath Lark, MD 08/23/2016 2:31 PM

## 2016-08-23 NOTE — Telephone Encounter (Signed)
Gave patient avs  °

## 2016-08-23 NOTE — Assessment & Plan Note (Signed)
She has intermittent bursitis of the right hip I will order inflammatory markers to assess

## 2016-08-23 NOTE — Assessment & Plan Note (Signed)
Overall, I felt that the leukocytosis is likely due to reactive process She has intermittent normal WBC in 2016 The most likely source of reactive leukocytosis is her skin or possibly from inflammatory joint secondary to bursitis I will order some basic workup to rule out autoimmune disorder, urinary tract infection and to repeat CBC I will call with test results If test results confirm reactive process, she does not need long-term follow-up

## 2016-08-24 LAB — C-REACTIVE PROTEIN: CRP: 15.6 mg/L — ABNORMAL HIGH (ref 0.0–4.9)

## 2016-08-24 LAB — SEDIMENTATION RATE: Sedimentation Rate-Westergren: 50 mm/hr — ABNORMAL HIGH (ref 0–32)

## 2016-08-24 LAB — URINE CULTURE

## 2016-08-26 ENCOUNTER — Telehealth: Payer: Self-pay | Admitting: Hematology and Oncology

## 2016-08-26 LAB — ANTINUCLEAR ANTIBODIES, IFA: ANA Titer 1: NEGATIVE

## 2016-08-26 NOTE — Telephone Encounter (Signed)
I left the patient a voicemail to review test results Inflammatory markers are high, suggestive of active inflammation, likely from skin rash or bursitis. The pattern of leukocytosis is suggestive of reactive leukocytosis.   She needs to get her diabetes under good control to prevent recurrent skin rash and if needed, she need is to follow-up with orthopedic doctor for treatment of bursitis. She does not need long-term follow-up.  Urine culture is negative.

## 2016-09-02 DIAGNOSIS — S92354D Nondisplaced fracture of fifth metatarsal bone, right foot, subsequent encounter for fracture with routine healing: Secondary | ICD-10-CM | POA: Diagnosis not present

## 2016-10-30 DIAGNOSIS — E119 Type 2 diabetes mellitus without complications: Secondary | ICD-10-CM | POA: Diagnosis not present

## 2016-10-30 DIAGNOSIS — Z6836 Body mass index (BMI) 36.0-36.9, adult: Secondary | ICD-10-CM | POA: Diagnosis not present

## 2016-10-30 DIAGNOSIS — D72828 Other elevated white blood cell count: Secondary | ICD-10-CM | POA: Diagnosis not present

## 2016-10-30 DIAGNOSIS — I10 Essential (primary) hypertension: Secondary | ICD-10-CM | POA: Diagnosis not present

## 2016-10-30 DIAGNOSIS — E782 Mixed hyperlipidemia: Secondary | ICD-10-CM | POA: Diagnosis not present

## 2016-12-30 DIAGNOSIS — S92351D Displaced fracture of fifth metatarsal bone, right foot, subsequent encounter for fracture with routine healing: Secondary | ICD-10-CM | POA: Diagnosis not present

## 2017-01-16 DIAGNOSIS — K080 Exfoliation of teeth due to systemic causes: Secondary | ICD-10-CM | POA: Diagnosis not present

## 2017-02-20 DIAGNOSIS — D72829 Elevated white blood cell count, unspecified: Secondary | ICD-10-CM | POA: Diagnosis not present

## 2017-02-20 DIAGNOSIS — I10 Essential (primary) hypertension: Secondary | ICD-10-CM | POA: Diagnosis not present

## 2017-02-20 DIAGNOSIS — E119 Type 2 diabetes mellitus without complications: Secondary | ICD-10-CM | POA: Diagnosis not present

## 2017-04-20 DIAGNOSIS — H6692 Otitis media, unspecified, left ear: Secondary | ICD-10-CM | POA: Diagnosis not present

## 2017-04-20 DIAGNOSIS — J069 Acute upper respiratory infection, unspecified: Secondary | ICD-10-CM | POA: Diagnosis not present

## 2017-04-24 DIAGNOSIS — J069 Acute upper respiratory infection, unspecified: Secondary | ICD-10-CM | POA: Diagnosis not present

## 2017-05-23 ENCOUNTER — Other Ambulatory Visit: Payer: Self-pay

## 2017-06-11 DIAGNOSIS — E669 Obesity, unspecified: Secondary | ICD-10-CM | POA: Diagnosis not present

## 2017-06-11 DIAGNOSIS — E1169 Type 2 diabetes mellitus with other specified complication: Secondary | ICD-10-CM | POA: Diagnosis not present

## 2017-06-11 DIAGNOSIS — E1165 Type 2 diabetes mellitus with hyperglycemia: Secondary | ICD-10-CM | POA: Diagnosis not present

## 2017-06-11 DIAGNOSIS — J309 Allergic rhinitis, unspecified: Secondary | ICD-10-CM | POA: Diagnosis not present

## 2017-06-11 DIAGNOSIS — I10 Essential (primary) hypertension: Secondary | ICD-10-CM | POA: Diagnosis not present

## 2017-06-11 DIAGNOSIS — E559 Vitamin D deficiency, unspecified: Secondary | ICD-10-CM | POA: Diagnosis not present

## 2017-06-11 DIAGNOSIS — Z0000 Encounter for general adult medical examination without abnormal findings: Secondary | ICD-10-CM | POA: Diagnosis not present

## 2017-07-04 ENCOUNTER — Other Ambulatory Visit: Payer: Self-pay

## 2017-07-04 MED ORDER — NORETHINDRONE 0.35 MG PO TABS: 1.0000 | ORAL_TABLET | Freq: Every day | ORAL | 1 refills | Status: DC

## 2017-07-04 NOTE — Telephone Encounter (Addendum)
Pharmacy sent a fax request for new Rx for 90 days supply "norethindrone". We do have her chart from [facility] and she was on this Rx at her last visit on 03/01/16 which was her annual . She is scheduled her at [facility] t0 see you for CE on 10/24/17.  Ok to refill?

## 2017-07-21 DIAGNOSIS — K080 Exfoliation of teeth due to systemic causes: Secondary | ICD-10-CM | POA: Diagnosis not present

## 2017-09-10 DIAGNOSIS — Z6839 Body mass index (BMI) 39.0-39.9, adult: Secondary | ICD-10-CM | POA: Diagnosis not present

## 2017-09-10 DIAGNOSIS — Z9884 Bariatric surgery status: Secondary | ICD-10-CM | POA: Diagnosis not present

## 2017-09-23 DIAGNOSIS — I10 Essential (primary) hypertension: Secondary | ICD-10-CM | POA: Diagnosis not present

## 2017-09-23 DIAGNOSIS — E119 Type 2 diabetes mellitus without complications: Secondary | ICD-10-CM | POA: Diagnosis not present

## 2017-09-23 DIAGNOSIS — E669 Obesity, unspecified: Secondary | ICD-10-CM | POA: Diagnosis not present

## 2017-09-23 DIAGNOSIS — Z6839 Body mass index (BMI) 39.0-39.9, adult: Secondary | ICD-10-CM | POA: Diagnosis not present

## 2017-10-24 ENCOUNTER — Ambulatory Visit (INDEPENDENT_AMBULATORY_CARE_PROVIDER_SITE_OTHER): Payer: Federal, State, Local not specified - PPO | Admitting: Obstetrics & Gynecology

## 2017-10-24 ENCOUNTER — Encounter: Payer: Self-pay | Admitting: Obstetrics & Gynecology

## 2017-10-24 VITALS — BP 122/74 | Ht 64.0 in | Wt 217.0 lb

## 2017-10-24 DIAGNOSIS — Z6837 Body mass index (BMI) 37.0-37.9, adult: Secondary | ICD-10-CM | POA: Diagnosis not present

## 2017-10-24 DIAGNOSIS — Z01419 Encounter for gynecological examination (general) (routine) without abnormal findings: Secondary | ICD-10-CM | POA: Diagnosis not present

## 2017-10-24 DIAGNOSIS — Z3041 Encounter for surveillance of contraceptive pills: Secondary | ICD-10-CM

## 2017-10-24 DIAGNOSIS — E669 Obesity, unspecified: Secondary | ICD-10-CM | POA: Diagnosis not present

## 2017-10-24 DIAGNOSIS — Z6838 Body mass index (BMI) 38.0-38.9, adult: Secondary | ICD-10-CM | POA: Diagnosis not present

## 2017-10-24 DIAGNOSIS — E119 Type 2 diabetes mellitus without complications: Secondary | ICD-10-CM | POA: Diagnosis not present

## 2017-10-24 DIAGNOSIS — E6609 Other obesity due to excess calories: Secondary | ICD-10-CM | POA: Diagnosis not present

## 2017-10-24 DIAGNOSIS — I10 Essential (primary) hypertension: Secondary | ICD-10-CM | POA: Diagnosis not present

## 2017-10-24 MED ORDER — NORETHINDRONE 0.35 MG PO TABS: 1.0000 | ORAL_TABLET | Freq: Every day | ORAL | 4 refills | Status: DC

## 2017-10-24 NOTE — Patient Instructions (Signed)
1. Well female exam with routine gynecological exam Normal gynecologic exam.  Pap test negative in 2018.  No indication to repeat this year.  Breast exam normal.  Will schedule screening mammogram at the breast center.  Health labs with family physician.  2. Encounter for surveillance of contraceptive pills Well on the progestin only pill.  No contraindication.  Prescription sent to pharmacy.  3. Class 2 obesity due to excess calories without serious comorbidity with body mass index (BMI) of 37.0 to 37.9 in adult Obesity with a body mass index of 37.25 on a weight loss program.  Working with a nutritionist and on [place].  Recommend aerobic physical activity 5 times a week and weightlifting every 2 days.  Zela, it was a pleasure seeing you today!

## 2017-10-24 NOTE — Progress Notes (Signed)
Lauren Hale 1970-05-23 [id number]   History:    47 y.o. G0 Engaged  RP:  Established patient presenting for annual gyn exam   HPI: Well on the Progestin-only pill with light menses.  No BTB.  No pelvic pain.  H/O Myomectomies.  No pain with IC.  Urine/BMs wnl.  Breasts s/p reduction, normal.  BMI 37.25.  Actively trying to loose weight.  S/P Bariatric surgery.  Currently on Contrave and working with a nutritionist and increasing physical activity.  Past medical history,surgical history, family history and social history were all reviewed and documented in the EPIC chart.  Gynecologic History Patient's last menstrual period was 10/18/2017. Contraception: oral progesterone-only contraceptive Last Pap: 2018. Results were: normal per patient.  Will obtain from [place] mammogram: 2018. Results were: normal per patient.  Will schedule at the [facility] Center now Bone Density: Never Colonoscopy: Never  Obstetric History OB History  Gravida Para Term Preterm AB Living  0 0 0 0 0 0  SAB TAB Ectopic Multiple Live Births  0 0 0 0 0     ROS: A ROS was performed and pertinent positives and negatives are included in the history.  GENERAL: No fevers or chills. HEENT: No change in vision, no earache, sore throat or sinus congestion. NECK: No pain or stiffness. CARDIOVASCULAR: No chest pain or pressure. No palpitations. PULMONARY: No shortness of breath, cough or wheeze. GASTROINTESTINAL: No abdominal pain, nausea, vomiting or diarrhea, melena or bright red blood per rectum. GENITOURINARY: No urinary frequency, urgency, hesitancy or dysuria. MUSCULOSKELETAL: No joint or muscle pain, no back pain, no recent trauma. DERMATOLOGIC: No rash, no itching, no lesions. ENDOCRINE: No polyuria, polydipsia, no heat or cold intolerance. No recent change in weight. HEMATOLOGICAL: No anemia or easy bruising or bleeding. NEUROLOGIC: No headache, seizures, numbness, tingling or weakness. PSYCHIATRIC:  No depression, no loss of interest in normal activity or change in sleep pattern.     Exam:   BP 122/74   Ht 5\' 4"  (1.626 m)   Wt 217 lb (98.4 kg)   LMP 10/18/2017 Comment: pill   BMI 37.25 kg/m   Body mass index is 37.25 kg/m.  General appearance : Well developed well nourished female. No acute distress HEENT: Eyes: no retinal hemorrhage or exudates,  Neck supple, trachea midline, no carotid bruits, no thyroidmegaly Lungs: Clear to auscultation, no rhonchi or wheezes, or rib retractions  Heart: Regular rate and rhythm, no murmurs or gallops Breast:Examined in sitting and supine position were symmetrical in appearance, no palpable masses or tenderness,  no skin retraction, no nipple inversion, no nipple discharge, no skin discoloration, no axillary or supraclavicular lymphadenopathy Abdomen: no palpable masses or tenderness, no rebound or guarding Extremities: no edema or skin discoloration or tenderness  Pelvic: Vulva: Normal             Vagina: No gross lesions or discharge  Cervix: No gross lesions or discharge  Uterus  AV, normal size, shape and consistency, non-tender and mobile  Adnexa  Without masses or tenderness  Anus: Normal   Assessment/Plan:  47 y.o. female for annual exam   1. Well female exam with routine gynecological exam Normal gynecologic exam.  Pap test negative in 2018.  No indication to repeat this year.  Breast exam normal.  Will schedule screening mammogram at the breast center.  Health labs with family physician.  2. Encounter for surveillance of contraceptive pills Well on the progestin only pill.  No contraindication.  Prescription  sent to pharmacy.  3. Class 2 obesity due to excess calories without serious comorbidity with body mass index (BMI) of 37.0 to 37.9 in adult Obesity with a body mass index of 37.25 on a weight loss program.  Working with a nutritionist and on [place].  Recommend aerobic physical activity 5 times a week and weightlifting  every 2 days.  Princess Bruins MD, 11:43 AM 10/24/2017

## 2017-11-07 DIAGNOSIS — R948 Abnormal results of function studies of other organs and systems: Secondary | ICD-10-CM | POA: Diagnosis not present

## 2017-11-07 DIAGNOSIS — Z6838 Body mass index (BMI) 38.0-38.9, adult: Secondary | ICD-10-CM | POA: Diagnosis not present

## 2017-11-07 DIAGNOSIS — E669 Obesity, unspecified: Secondary | ICD-10-CM | POA: Diagnosis not present

## 2017-11-10 ENCOUNTER — Other Ambulatory Visit: Payer: Self-pay | Admitting: Family Medicine

## 2017-11-10 DIAGNOSIS — Z1231 Encounter for screening mammogram for malignant neoplasm of breast: Secondary | ICD-10-CM

## 2017-11-14 DIAGNOSIS — Z9884 Bariatric surgery status: Secondary | ICD-10-CM | POA: Diagnosis not present

## 2017-11-14 DIAGNOSIS — E669 Obesity, unspecified: Secondary | ICD-10-CM | POA: Diagnosis not present

## 2017-11-14 DIAGNOSIS — I10 Essential (primary) hypertension: Secondary | ICD-10-CM | POA: Diagnosis not present

## 2017-11-14 DIAGNOSIS — Z713 Dietary counseling and surveillance: Secondary | ICD-10-CM | POA: Diagnosis not present

## 2017-11-14 DIAGNOSIS — Z6839 Body mass index (BMI) 39.0-39.9, adult: Secondary | ICD-10-CM | POA: Diagnosis not present

## 2017-11-17 DIAGNOSIS — S9031XA Contusion of right foot, initial encounter: Secondary | ICD-10-CM | POA: Diagnosis not present

## 2017-12-05 DIAGNOSIS — S92354A Nondisplaced fracture of fifth metatarsal bone, right foot, initial encounter for closed fracture: Secondary | ICD-10-CM | POA: Diagnosis not present

## 2017-12-09 DIAGNOSIS — E1169 Type 2 diabetes mellitus with other specified complication: Secondary | ICD-10-CM | POA: Diagnosis not present

## 2017-12-09 DIAGNOSIS — R635 Abnormal weight gain: Secondary | ICD-10-CM | POA: Diagnosis not present

## 2017-12-09 DIAGNOSIS — I10 Essential (primary) hypertension: Secondary | ICD-10-CM | POA: Diagnosis not present

## 2017-12-09 DIAGNOSIS — E669 Obesity, unspecified: Secondary | ICD-10-CM | POA: Diagnosis not present

## 2017-12-19 ENCOUNTER — Ambulatory Visit
Admission: RE | Admit: 2017-12-19 | Discharge: 2017-12-19 | Disposition: A | Discharge status: Home / Self Care | Payer: Federal, State, Local not specified - PPO | Source: Ambulatory Visit | Attending: Family Medicine | Admitting: Family Medicine

## 2017-12-19 DIAGNOSIS — Z1231 Encounter for screening mammogram for malignant neoplasm of breast: Secondary | ICD-10-CM

## 2017-12-24 DIAGNOSIS — S92354A Nondisplaced fracture of fifth metatarsal bone, right foot, initial encounter for closed fracture: Secondary | ICD-10-CM | POA: Diagnosis not present

## 2018-01-05 DIAGNOSIS — L858 Other specified epidermal thickening: Secondary | ICD-10-CM | POA: Diagnosis not present

## 2018-01-05 DIAGNOSIS — L819 Disorder of pigmentation, unspecified: Secondary | ICD-10-CM | POA: Diagnosis not present

## 2018-01-21 DIAGNOSIS — K080 Exfoliation of teeth due to systemic causes: Secondary | ICD-10-CM | POA: Diagnosis not present

## 2018-02-12 ENCOUNTER — Telehealth: Payer: Self-pay | Admitting: *Deleted

## 2018-02-12 NOTE — Telephone Encounter (Signed)
Yes, agree to send Fluconazole 150 mg 1 tab PO daily x 3.  #3, refill x 1.

## 2018-02-12 NOTE — Telephone Encounter (Signed)
Patient called c/o yeast infection asked if you would be willing to send in Rx for yeast without visit? Please advise

## 2018-02-13 MED ORDER — FLUCONAZOLE 150 MG PO TABS: 150.0000 mg | ORAL_TABLET | Freq: Every day | ORAL | 1 refills | Status: DC

## 2018-02-13 NOTE — Telephone Encounter (Signed)
Patient informed, Rx sent.  

## 2018-06-24 ENCOUNTER — Other Ambulatory Visit: Payer: Self-pay | Admitting: *Deleted

## 2018-06-24 DIAGNOSIS — Z20822 Contact with and (suspected) exposure to covid-19: Secondary | ICD-10-CM

## 2018-06-24 DIAGNOSIS — R6889 Other general symptoms and signs: Secondary | ICD-10-CM | POA: Diagnosis not present

## 2018-06-24 NOTE — Progress Notes (Signed)
[id number] 

## 2018-06-25 LAB — NOVEL CORONAVIRUS, NAA: SARS-CoV-2, NAA: NOT DETECTED

## 2018-06-26 DIAGNOSIS — Z1159 Encounter for screening for other viral diseases: Secondary | ICD-10-CM | POA: Diagnosis not present

## 2018-07-06 DIAGNOSIS — E782 Mixed hyperlipidemia: Secondary | ICD-10-CM | POA: Diagnosis not present

## 2018-07-06 DIAGNOSIS — I10 Essential (primary) hypertension: Secondary | ICD-10-CM | POA: Diagnosis not present

## 2018-07-06 DIAGNOSIS — Z0000 Encounter for general adult medical examination without abnormal findings: Secondary | ICD-10-CM | POA: Diagnosis not present

## 2018-07-06 DIAGNOSIS — E1169 Type 2 diabetes mellitus with other specified complication: Secondary | ICD-10-CM | POA: Diagnosis not present

## 2018-07-06 DIAGNOSIS — E559 Vitamin D deficiency, unspecified: Secondary | ICD-10-CM | POA: Diagnosis not present

## 2018-07-06 DIAGNOSIS — Z23 Encounter for immunization: Secondary | ICD-10-CM | POA: Diagnosis not present

## 2018-07-14 ENCOUNTER — Other Ambulatory Visit: Payer: Self-pay | Admitting: Family Medicine

## 2018-07-14 DIAGNOSIS — Z1231 Encounter for screening mammogram for malignant neoplasm of breast: Secondary | ICD-10-CM

## 2018-09-28 DIAGNOSIS — Z20828 Contact with and (suspected) exposure to other viral communicable diseases: Secondary | ICD-10-CM | POA: Diagnosis not present

## 2018-10-26 ENCOUNTER — Other Ambulatory Visit: Payer: Self-pay

## 2018-10-27 ENCOUNTER — Ambulatory Visit: Payer: Federal, State, Local not specified - PPO | Admitting: Obstetrics & Gynecology

## 2018-10-27 ENCOUNTER — Encounter: Payer: Self-pay | Admitting: Obstetrics & Gynecology

## 2018-10-27 VITALS — BP 128/86

## 2018-10-27 DIAGNOSIS — N9089 Other specified noninflammatory disorders of vulva and perineum: Secondary | ICD-10-CM

## 2018-10-27 DIAGNOSIS — Z1159 Encounter for screening for other viral diseases: Secondary | ICD-10-CM | POA: Diagnosis not present

## 2018-10-27 DIAGNOSIS — Z113 Encounter for screening for infections with a predominantly sexual mode of transmission: Secondary | ICD-10-CM | POA: Diagnosis not present

## 2018-10-27 NOTE — Progress Notes (Signed)
    Lauren Hale 12-21-1970 [id number]        48 y.o.  G0 Engaged  RP: Right tender vulvar lesion x 10 days  HPI: Right tender vulvar lesion x 10 days.  Well on the Progestin-only pill with light menses.  No BTB.  No pelvic pain.  No pain with IC.  No abnormal vaginal discharge.  No fever.   OB History  Gravida Para Term Preterm AB Living  0 0 0 0 0 0  SAB TAB Ectopic Multiple Live Births  0 0 0 0 0    Past medical history,surgical history, problem list, medications, allergies, family history and social history were all reviewed and documented in the EPIC chart.   Directed ROS with pertinent positives and negatives documented in the history of present illness/assessment and plan.  Exam:  Vitals:   10/27/18 0906  BP: 128/86   General appearance:  Normal  Gynecologic exam: Vulva:  Rt healed vulvar lesion and small fissure.  HSV Sure Swab done.  Speculum:  Cervix/Vagina normal.  Normal secretions.  Gono-Chlam done.   Assessment/Plan:  48 y.o. G0P0000   1. Vulvar lesion Vulvar irritation with a small fissure.  R/O genital HSV.  SureSwab HSV 1-2 done today. - SureSwab HSV, Type 1/2 DNA, PCR  2. Screen for STD (sexually transmitted disease) Strick condom use recommended. - C. trachomatis/N. gonorrhoeae RNA - SureSwab HSV, Type 1/2 DNA, PCR - HIV antibody (with reflex) - RPR - Hepatitis C Antibody - Hepatitis B Surface AntiGEN  Counseling on above issues and coordination of care more than 50% for 25 minutes.  Princess Bruins MD, 9:12 AM 10/27/2018

## 2018-10-28 LAB — C. TRACHOMATIS/N. GONORRHOEAE RNA
C. trachomatis RNA, TMA: NOT DETECTED
N. gonorrhoeae RNA, TMA: NOT DETECTED

## 2018-10-28 LAB — HEPATITIS C ANTIBODY
Hepatitis C Ab: NONREACTIVE
SIGNAL TO CUT-OFF: 0.01 (ref <1.00)

## 2018-10-28 LAB — RPR: RPR Ser Ql: NONREACTIVE

## 2018-10-28 LAB — HIV ANTIBODY (ROUTINE TESTING W REFLEX): HIV 1&2 Ab, 4th Generation: NONREACTIVE

## 2018-10-28 LAB — HEPATITIS B SURFACE ANTIGEN: Hepatitis B Surface Ag: NONREACTIVE

## 2018-10-29 LAB — SURESWAB HSV, TYPE 1/2 DNA, PCR
HSV 1 DNA: NOT DETECTED
HSV 2 DNA: NOT DETECTED

## 2018-11-03 ENCOUNTER — Encounter: Payer: Self-pay | Admitting: Obstetrics & Gynecology

## 2018-11-03 NOTE — Patient Instructions (Signed)
1. Vulvar lesion Vulvar irritation with a small fissure.  R/O genital HSV.  SureSwab HSV 1-2 done today. - SureSwab HSV, Type 1/2 DNA, PCR  2. Screen for STD (sexually transmitted disease) Strick condom use recommended. - C. trachomatis/N. gonorrhoeae RNA - SureSwab HSV, Type 1/2 DNA, PCR - HIV antibody (with reflex) - RPR - Hepatitis C Antibody - Hepatitis B Surface AntiGEN  [name], it was a pleasure seeing you today!  I will inform you of your results as soon as they are available.

## 2018-11-05 ENCOUNTER — Other Ambulatory Visit: Payer: Self-pay

## 2018-11-06 ENCOUNTER — Ambulatory Visit: Payer: Federal, State, Local not specified - PPO | Admitting: Obstetrics & Gynecology

## 2018-11-06 ENCOUNTER — Encounter: Payer: Self-pay | Admitting: Obstetrics & Gynecology

## 2018-11-06 VITALS — BP 132/84 | Ht 63.0 in | Wt 212.0 lb

## 2018-11-06 DIAGNOSIS — Z6837 Body mass index (BMI) 37.0-37.9, adult: Secondary | ICD-10-CM | POA: Diagnosis not present

## 2018-11-06 DIAGNOSIS — Z01419 Encounter for gynecological examination (general) (routine) without abnormal findings: Secondary | ICD-10-CM

## 2018-11-06 DIAGNOSIS — Z3041 Encounter for surveillance of contraceptive pills: Secondary | ICD-10-CM | POA: Diagnosis not present

## 2018-11-06 DIAGNOSIS — B372 Candidiasis of skin and nail: Secondary | ICD-10-CM

## 2018-11-06 DIAGNOSIS — E6609 Other obesity due to excess calories: Secondary | ICD-10-CM

## 2018-11-06 MED ORDER — NYSTATIN 100000 UNIT/GM EX POWD: Freq: Three times a day (TID) | CUTANEOUS | 4 refills | Status: DC

## 2018-11-06 MED ORDER — NORETHINDRONE 0.35 MG PO TABS: 1.0000 | ORAL_TABLET | Freq: Every day | ORAL | 4 refills | Status: DC

## 2018-11-06 NOTE — Progress Notes (Signed)
Lauren Hale 09/06/1970 [id number]   History:    48 y.o. G0 Engaged  RP:  Established patient presenting for annual gyn exam   HPI: Well on the Progestin-only pill with light menses.  No BTB.  No pelvic pain.  H/O Myomectomies.  No pain with IC.  Urine/BMs wnl.  Yeast of skin in folds. Breasts s/p reduction, normal.  BMI 37.55.  Actively trying to loose weight.  S/P Bariatric surgery.  Needs to reincreasing physical activity and restart a low calorie/low carb diet.  Past medical history,surgical history, family history and social history were all reviewed and documented in the EPIC chart.  Gynecologic History No LMP recorded. (Menstrual status: Oral contraceptives). Contraception: oral progesterone-only contraceptive Last Pap: 2018. Results were: normal per patient Last mammogram: 12/2017. Results were: Negative Bone Density: Never Colonoscopy: Never  Obstetric History OB History  Gravida Para Term Preterm AB Living  0 0 0 0 0 0  SAB TAB Ectopic Multiple Live Births  0 0 0 0 0     ROS: A ROS was performed and pertinent positives and negatives are included in the history.  GENERAL: No fevers or chills. HEENT: No change in vision, no earache, sore throat or sinus congestion. NECK: No pain or stiffness. CARDIOVASCULAR: No chest pain or pressure. No palpitations. PULMONARY: No shortness of breath, cough or wheeze. GASTROINTESTINAL: No abdominal pain, nausea, vomiting or diarrhea, melena or bright red blood per rectum. GENITOURINARY: No urinary frequency, urgency, hesitancy or dysuria. MUSCULOSKELETAL: No joint or muscle pain, no back pain, no recent trauma. DERMATOLOGIC: No rash, no itching, no lesions. ENDOCRINE: No polyuria, polydipsia, no heat or cold intolerance. No recent change in weight. HEMATOLOGICAL: No anemia or easy bruising or bleeding. NEUROLOGIC: No headache, seizures, numbness, tingling or weakness. PSYCHIATRIC: No depression, no loss of interest in normal activity  or change in sleep pattern.     Exam:   BP 132/84   Ht 5\' 3"  (1.6 m)   Wt 212 lb (96.2 kg)   BMI 37.55 kg/m   Body mass index is 37.55 kg/m.  General appearance : Well developed well nourished female. No acute distress HEENT: Eyes: no retinal hemorrhage or exudates,  Neck supple, trachea midline, no carotid bruits, no thyroidmegaly Lungs: Clear to auscultation, no rhonchi or wheezes, or rib retractions  Heart: Regular rate and rhythm, no murmurs or gallops Breast:Examined in sitting and supine position were symmetrical in appearance, no palpable masses or tenderness,  no skin retraction, no nipple inversion, no nipple discharge, no skin discoloration, no axillary or supraclavicular lymphadenopathy Abdomen: no palpable masses or tenderness, no rebound or guarding Extremities: no edema or skin discoloration or tenderness  Pelvic: Vulva: Normal             Vagina: No gross lesions or discharge  Cervix: No gross lesions or discharge.  Pap reflex done  Uterus  AV, normal size, shape and consistency, non-tender and mobile  Adnexa  Without masses or tenderness  Anus: Normal   Assessment/Plan:  48 y.o. female for annual exam   1. Encounter for routine gynecological examination with Papanicolaou smear of cervix Normal gynecologic exam.  Pap reflex done.  Breast exam normal.  Screening mammogram December 2019 was negative.  Health labs with family physician.  2. Encounter for surveillance of contraceptive pills Well on the progestin only birth control pills.  No contraindication to continue.  Progestin pill represcribed.  3. Yeast infection of the skin Frequent yeast infections in the skin folds.  Nystatin powder prescribed.  Usage reviewed.  Prescription sent to pharmacy.  4. Class 2 obesity due to excess calories without serious comorbidity with body mass index (BMI) of 37.0 to 37.9 in adult Recommend a lower calorie/carb diet such as [place].  Aerobic physical activities  5 times a week and weightlifting every 2 days.  Other orders - norethindrone (MICRONOR) 0.35 MG tablet; Take 1 tablet (0.35 mg total) by mouth daily. - nystatin (MYCOSTATIN/NYSTOP) powder; Apply topically 3 (three) times daily.  Princess Bruins MD, 9:46 AM 11/06/2018

## 2018-11-06 NOTE — Patient Instructions (Signed)
1. Encounter for routine gynecological examination with Papanicolaou smear of cervix Normal gynecologic exam.  Pap reflex done.  Breast exam normal.  Screening mammogram December 2019 was negative.  Health labs with family physician.  2. Encounter for surveillance of contraceptive pills Well on the progestin only birth control pills.  No contraindication to continue.  Progestin pill represcribed.  3. Yeast infection of the skin Frequent yeast infections in the skin folds.  Nystatin powder prescribed.  Usage reviewed.  Prescription sent to pharmacy.  4. Class 2 obesity due to excess calories without serious comorbidity with body mass index (BMI) of 37.0 to 37.9 in adult Recommend a lower calorie/carb diet such as [place].  Aerobic physical activities 5 times a week and weightlifting every 2 days.  Other orders - norethindrone (MICRONOR) 0.35 MG tablet; Take 1 tablet (0.35 mg total) by mouth daily. - nystatin (MYCOSTATIN/NYSTOP) powder; Apply topically 3 (three) times daily.  Lauren Hale, it was a pleasure seeing you today!  I will inform you of your results as soon as they are available.

## 2018-11-06 NOTE — Addendum Note (Signed)
Addended by: Thurnell Garbe A on: 11/06/2018 02:03 PM   Modules accepted: Orders

## 2018-11-09 LAB — PAP IG W/ RFLX HPV ASCU

## 2018-11-11 ENCOUNTER — Encounter: Payer: Self-pay | Admitting: *Deleted

## 2018-11-17 DIAGNOSIS — F4323 Adjustment disorder with mixed anxiety and depressed mood: Secondary | ICD-10-CM | POA: Diagnosis not present

## 2018-12-03 DIAGNOSIS — F4323 Adjustment disorder with mixed anxiety and depressed mood: Secondary | ICD-10-CM | POA: Diagnosis not present

## 2018-12-17 DIAGNOSIS — F4323 Adjustment disorder with mixed anxiety and depressed mood: Secondary | ICD-10-CM | POA: Diagnosis not present

## 2018-12-19 ENCOUNTER — Other Ambulatory Visit: Payer: Self-pay

## 2018-12-19 DIAGNOSIS — Z20822 Contact with and (suspected) exposure to covid-19: Secondary | ICD-10-CM

## 2018-12-21 LAB — NOVEL CORONAVIRUS, NAA: SARS-CoV-2, NAA: NOT DETECTED

## 2018-12-23 ENCOUNTER — Other Ambulatory Visit: Payer: Self-pay

## 2018-12-23 ENCOUNTER — Ambulatory Visit
Admission: RE | Admit: 2018-12-23 | Discharge: 2018-12-23 | Disposition: A | Discharge status: Home / Self Care | Payer: Federal, State, Local not specified - PPO | Source: Ambulatory Visit | Attending: Family Medicine | Admitting: Family Medicine

## 2018-12-23 DIAGNOSIS — E782 Mixed hyperlipidemia: Secondary | ICD-10-CM | POA: Diagnosis not present

## 2018-12-23 DIAGNOSIS — E1169 Type 2 diabetes mellitus with other specified complication: Secondary | ICD-10-CM | POA: Diagnosis not present

## 2018-12-23 DIAGNOSIS — I10 Essential (primary) hypertension: Secondary | ICD-10-CM | POA: Diagnosis not present

## 2018-12-23 DIAGNOSIS — M238X1 Other internal derangements of right knee: Secondary | ICD-10-CM | POA: Diagnosis not present

## 2018-12-23 DIAGNOSIS — Z1231 Encounter for screening mammogram for malignant neoplasm of breast: Secondary | ICD-10-CM

## 2018-12-31 DIAGNOSIS — F4323 Adjustment disorder with mixed anxiety and depressed mood: Secondary | ICD-10-CM | POA: Diagnosis not present

## 2019-01-11 DIAGNOSIS — F4323 Adjustment disorder with mixed anxiety and depressed mood: Secondary | ICD-10-CM | POA: Diagnosis not present

## 2019-03-06 DIAGNOSIS — Z20822 Contact with and (suspected) exposure to covid-19: Secondary | ICD-10-CM | POA: Diagnosis not present

## 2019-03-06 DIAGNOSIS — Z20828 Contact with and (suspected) exposure to other viral communicable diseases: Secondary | ICD-10-CM | POA: Diagnosis not present

## 2019-03-11 DIAGNOSIS — F4323 Adjustment disorder with mixed anxiety and depressed mood: Secondary | ICD-10-CM | POA: Diagnosis not present

## 2019-04-04 DIAGNOSIS — R109 Unspecified abdominal pain: Secondary | ICD-10-CM | POA: Diagnosis not present

## 2019-06-15 DIAGNOSIS — F4323 Adjustment disorder with mixed anxiety and depressed mood: Secondary | ICD-10-CM | POA: Diagnosis not present

## 2019-07-08 DIAGNOSIS — Z0000 Encounter for general adult medical examination without abnormal findings: Secondary | ICD-10-CM | POA: Diagnosis not present

## 2019-07-08 DIAGNOSIS — I10 Essential (primary) hypertension: Secondary | ICD-10-CM | POA: Diagnosis not present

## 2019-07-08 DIAGNOSIS — E782 Mixed hyperlipidemia: Secondary | ICD-10-CM | POA: Diagnosis not present

## 2019-07-08 DIAGNOSIS — E1169 Type 2 diabetes mellitus with other specified complication: Secondary | ICD-10-CM | POA: Diagnosis not present

## 2019-08-31 DIAGNOSIS — R635 Abnormal weight gain: Secondary | ICD-10-CM | POA: Diagnosis not present

## 2019-08-31 DIAGNOSIS — E1165 Type 2 diabetes mellitus with hyperglycemia: Secondary | ICD-10-CM | POA: Diagnosis not present

## 2019-08-31 DIAGNOSIS — Z9884 Bariatric surgery status: Secondary | ICD-10-CM | POA: Diagnosis not present

## 2019-08-31 DIAGNOSIS — K219 Gastro-esophageal reflux disease without esophagitis: Secondary | ICD-10-CM | POA: Diagnosis not present

## 2019-09-27 DIAGNOSIS — R635 Abnormal weight gain: Secondary | ICD-10-CM | POA: Diagnosis not present

## 2019-10-14 DIAGNOSIS — K219 Gastro-esophageal reflux disease without esophagitis: Secondary | ICD-10-CM | POA: Diagnosis not present

## 2019-10-14 DIAGNOSIS — Z6839 Body mass index (BMI) 39.0-39.9, adult: Secondary | ICD-10-CM | POA: Diagnosis not present

## 2019-10-14 DIAGNOSIS — I10 Essential (primary) hypertension: Secondary | ICD-10-CM | POA: Diagnosis not present

## 2019-10-14 DIAGNOSIS — R635 Abnormal weight gain: Secondary | ICD-10-CM | POA: Diagnosis not present

## 2019-10-25 DIAGNOSIS — Z6838 Body mass index (BMI) 38.0-38.9, adult: Secondary | ICD-10-CM | POA: Diagnosis not present

## 2019-10-25 DIAGNOSIS — E669 Obesity, unspecified: Secondary | ICD-10-CM | POA: Diagnosis not present

## 2019-10-25 DIAGNOSIS — Z713 Dietary counseling and surveillance: Secondary | ICD-10-CM | POA: Diagnosis not present

## 2019-11-02 DIAGNOSIS — E119 Type 2 diabetes mellitus without complications: Secondary | ICD-10-CM | POA: Diagnosis not present

## 2019-11-02 DIAGNOSIS — K219 Gastro-esophageal reflux disease without esophagitis: Secondary | ICD-10-CM | POA: Diagnosis not present

## 2019-11-02 DIAGNOSIS — R635 Abnormal weight gain: Secondary | ICD-10-CM | POA: Diagnosis not present

## 2019-11-02 DIAGNOSIS — I10 Essential (primary) hypertension: Secondary | ICD-10-CM | POA: Diagnosis not present

## 2019-11-12 ENCOUNTER — Encounter: Payer: Federal, State, Local not specified - PPO | Admitting: Obstetrics & Gynecology

## 2019-11-15 ENCOUNTER — Encounter: Payer: Federal, State, Local not specified - PPO | Admitting: Obstetrics & Gynecology

## 2019-11-16 ENCOUNTER — Other Ambulatory Visit: Payer: Self-pay

## 2019-11-16 ENCOUNTER — Ambulatory Visit (INDEPENDENT_AMBULATORY_CARE_PROVIDER_SITE_OTHER): Payer: Federal, State, Local not specified - PPO | Admitting: Obstetrics & Gynecology

## 2019-11-16 ENCOUNTER — Encounter: Payer: Self-pay | Admitting: Obstetrics & Gynecology

## 2019-11-16 VITALS — BP 116/72 | Ht 63.25 in | Wt 216.0 lb

## 2019-11-16 DIAGNOSIS — Z3041 Encounter for surveillance of contraceptive pills: Secondary | ICD-10-CM | POA: Diagnosis not present

## 2019-11-16 DIAGNOSIS — Z6837 Body mass index (BMI) 37.0-37.9, adult: Secondary | ICD-10-CM

## 2019-11-16 DIAGNOSIS — B372 Candidiasis of skin and nail: Secondary | ICD-10-CM

## 2019-11-16 DIAGNOSIS — Z01419 Encounter for gynecological examination (general) (routine) without abnormal findings: Secondary | ICD-10-CM | POA: Diagnosis not present

## 2019-11-16 DIAGNOSIS — E6609 Other obesity due to excess calories: Secondary | ICD-10-CM

## 2019-11-16 MED ORDER — NYSTATIN 100000 UNIT/GM EX POWD
Freq: Three times a day (TID) | CUTANEOUS | 4 refills | Status: DC
Start: 2019-11-16 — End: 2020-11-16

## 2019-11-16 MED ORDER — NORETHINDRONE 0.35 MG PO TABS: 1.0000 | ORAL_TABLET | Freq: Every day | ORAL | 4 refills | Status: DC

## 2019-11-16 NOTE — Progress Notes (Signed)
Lauren Hale 1970-05-30 [id number]   History:    49 y.o. G0 Engaged  UE:[id number] presenting for annual gyn exam   YIA:XKPV on the Progestin-only pill with light menses. No BTB. No hot flushes or night sweats. No pelvic pain. H/O Myomectomies. No pain with IC. Urine/BMs wnl. Yeast of skin in folds, well using Nystatin powder and Monistat gel. Breasts s/p reduction, normal. BMI 37.96. Actively trying to loose weight. Followed at [facility]. S/P Bariatric surgery.    Past medical history,surgical history, family history and social history were all reviewed and documented in the EPIC chart.  Gynecologic History No LMP recorded. (Menstrual status: Oral contraceptives).  Obstetric History OB History  Gravida Para Term Preterm AB Living  0 0 0 0 0 0  SAB TAB Ectopic Multiple Live Births  0 0 0 0 0     ROS: A ROS was performed and pertinent positives and negatives are included in the history.  GENERAL: No fevers or chills. HEENT: No change in vision, no earache, sore throat or sinus congestion. NECK: No pain or stiffness. CARDIOVASCULAR: No chest pain or pressure. No palpitations. PULMONARY: No shortness of breath, cough or wheeze. GASTROINTESTINAL: No abdominal pain, nausea, vomiting or diarrhea, melena or bright red blood per rectum. GENITOURINARY: No urinary frequency, urgency, hesitancy or dysuria. MUSCULOSKELETAL: No joint or muscle pain, no back pain, no recent trauma. DERMATOLOGIC: No rash, no itching, no lesions. ENDOCRINE: No polyuria, polydipsia, no heat or cold intolerance. No recent change in weight. HEMATOLOGICAL: No anemia or easy bruising or bleeding. NEUROLOGIC: No headache, seizures, numbness, tingling or weakness. PSYCHIATRIC: No depression, no loss of interest in normal activity or change in sleep pattern.     Exam:   BP 116/72   Ht 5' 3.25" (1.607 m)   Wt 216 lb (98 kg)   BMI 37.96 kg/m   Body mass index is 37.96  kg/m.  General appearance : Well developed well nourished female. No acute distress HEENT: Eyes: no retinal hemorrhage or exudates,  Neck supple, trachea midline, no carotid bruits, no thyroidmegaly Lungs: Clear to auscultation, no rhonchi or wheezes, or rib retractions  Heart: Regular rate and rhythm, no murmurs or gallops Breast:Examined in sitting and supine position were symmetrical in appearance, no palpable masses or tenderness,  no skin retraction, no nipple inversion, no nipple discharge, no skin discoloration, no axillary or supraclavicular lymphadenopathy Abdomen: no palpable masses or tenderness, no rebound or guarding Extremities: no edema or skin discoloration or tenderness  Pelvic: Vulva: Normal             Vagina: No gross lesions or discharge  Cervix/Uterus absent  Adnexa  Without masses or tenderness  Anus: Normal   Assessment/Plan:  49 y.o. female for annual exam   1. Well female exam with routine gynecological exam Gynecologic exam status post total hysterectomy.  No indication for Pap test this year.  Breast exam normal.  Screening mammogram December 2020 was negative.  Health labs with family physician.  2. Encounter for surveillance of contraceptive pills Well on the progestin pill.  No contraindication.  Continue on the Progestin Pill.  Prescription sent to pharmacy.  3. Yeast infection of the skin Doing well with Nystatin powder and Monistat gel.  Nystatin powder represcribed.  4. Class 2 obesity due to excess calories without serious comorbidity with body mass index (BMI) of 37.0 to 37.9 in adult Followed at [facility] with nutritional counseling and fitness program.  Other orders - topiramate (  TOPAMAX) 25 MG tablet; Take 25 mg by mouth 2 (two) times daily. - norethindrone (MICRONOR) 0.35 MG tablet; Take 1 tablet (0.35 mg total) by mouth daily. - nystatin (MYCOSTATIN/NYSTOP) powder; Apply topically 3 (three) times daily.  Princess Bruins MD, 4:54 PM  11/16/2019

## 2019-11-20 ENCOUNTER — Encounter: Payer: Self-pay | Admitting: Obstetrics & Gynecology

## 2019-11-25 DIAGNOSIS — Z9884 Bariatric surgery status: Secondary | ICD-10-CM | POA: Diagnosis not present

## 2019-11-25 DIAGNOSIS — K219 Gastro-esophageal reflux disease without esophagitis: Secondary | ICD-10-CM | POA: Diagnosis not present

## 2019-11-25 DIAGNOSIS — K224 Dyskinesia of esophagus: Secondary | ICD-10-CM | POA: Diagnosis not present

## 2019-12-20 IMAGING — MG DIGITAL SCREENING BILAT W/ TOMO W/ CAD
8 series · 8 of 24 positions shown · non-contrast
Comparison: Previous exam(s).

CLINICAL DATA: Screening.

EXAM:
DIGITAL SCREENING BILATERAL MAMMOGRAM WITH TOMO AND CAD

[R CC synth-2D]
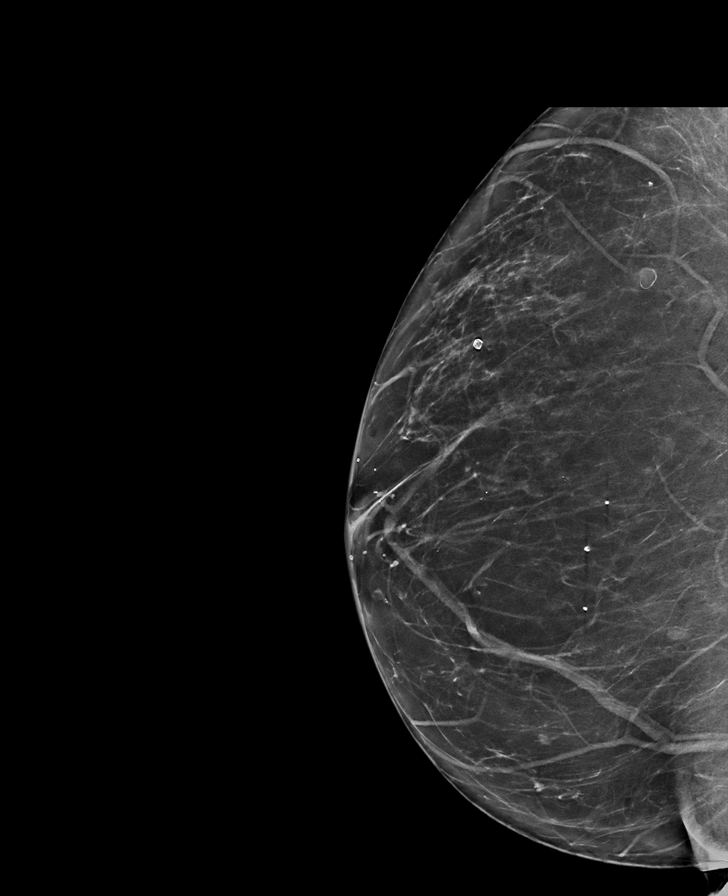

[L MLO synth-2D]
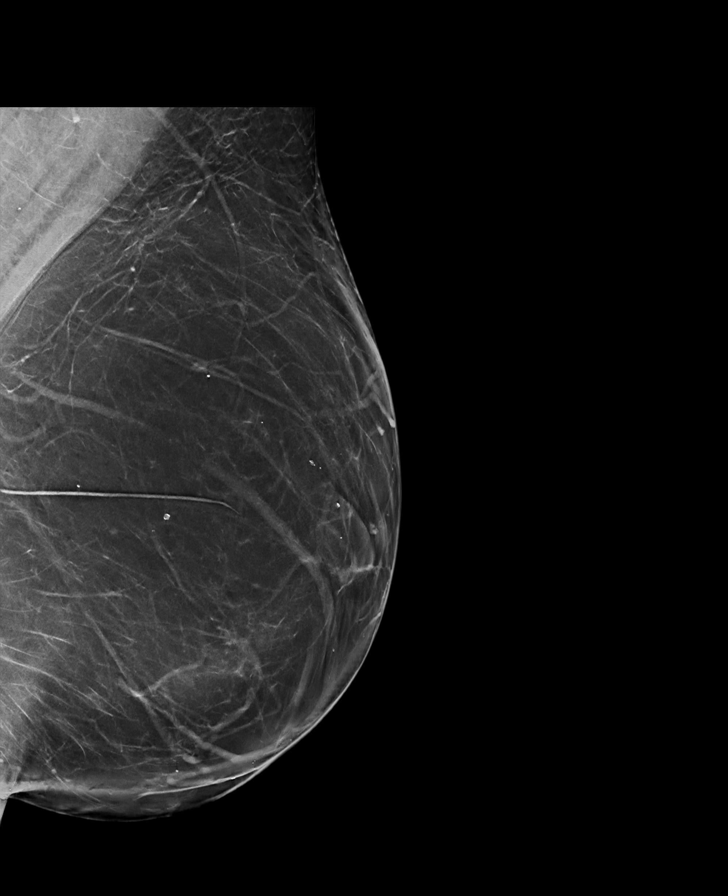

[R MLO synth-2D]
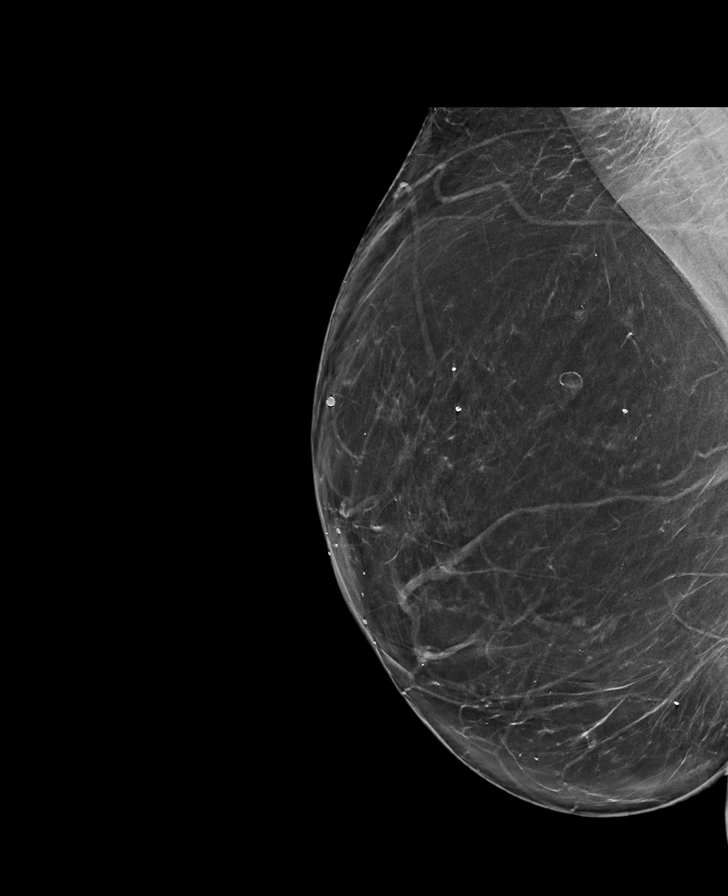

[L CC synth-2D]
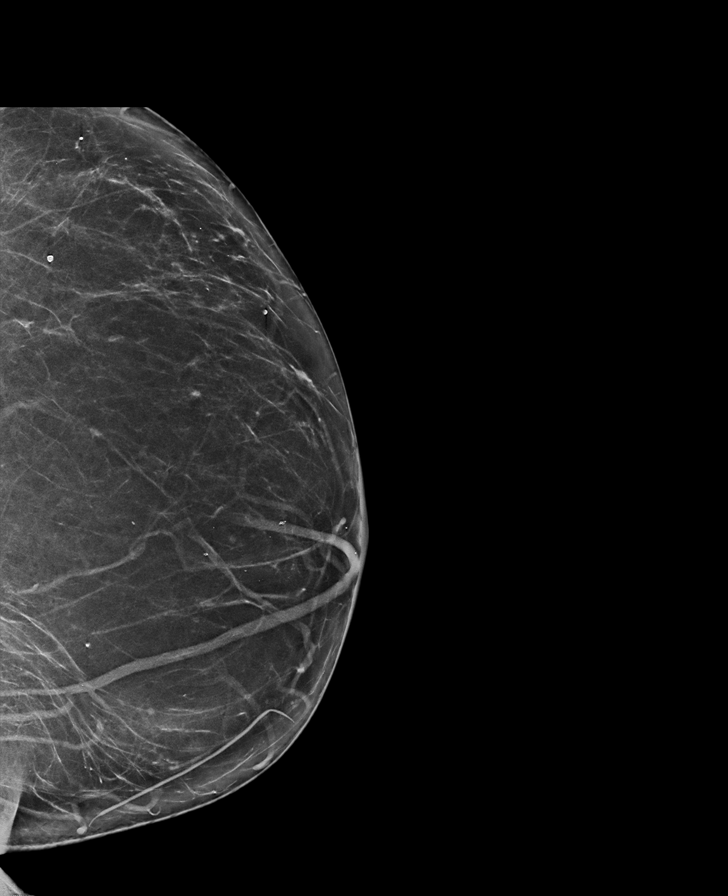

[R CC tomo · tomo slice 39/76.0]
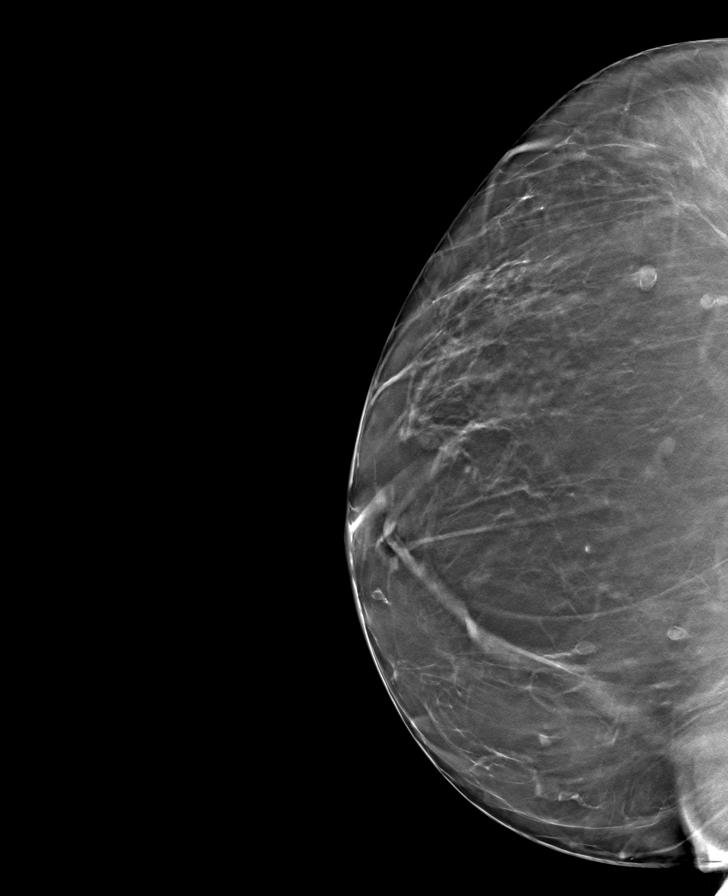

[R MLO tomo · tomo slice 43/85.0]
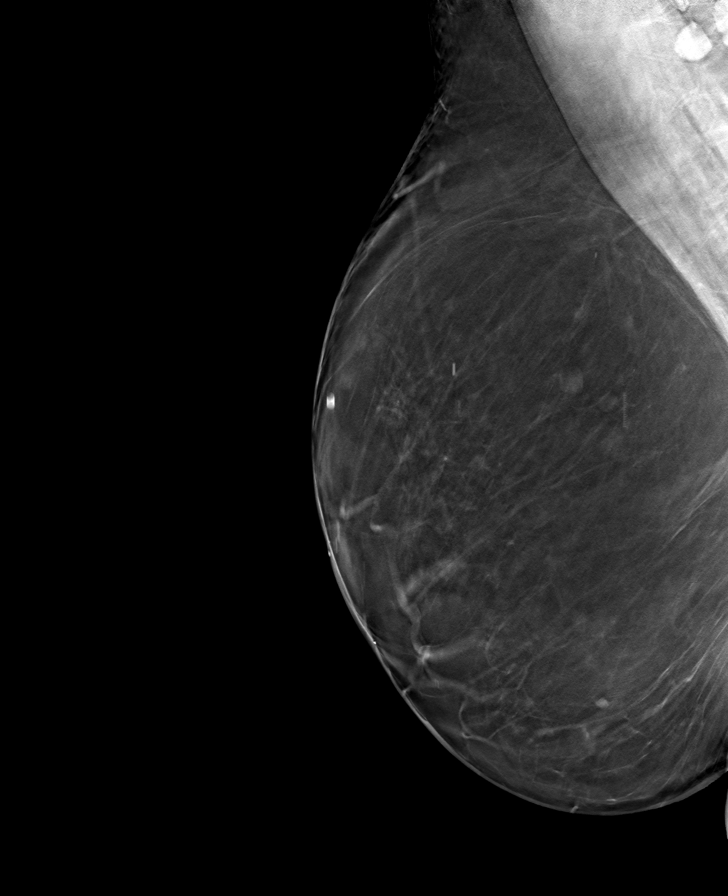

[L MLO tomo · tomo slice 45/89.0]
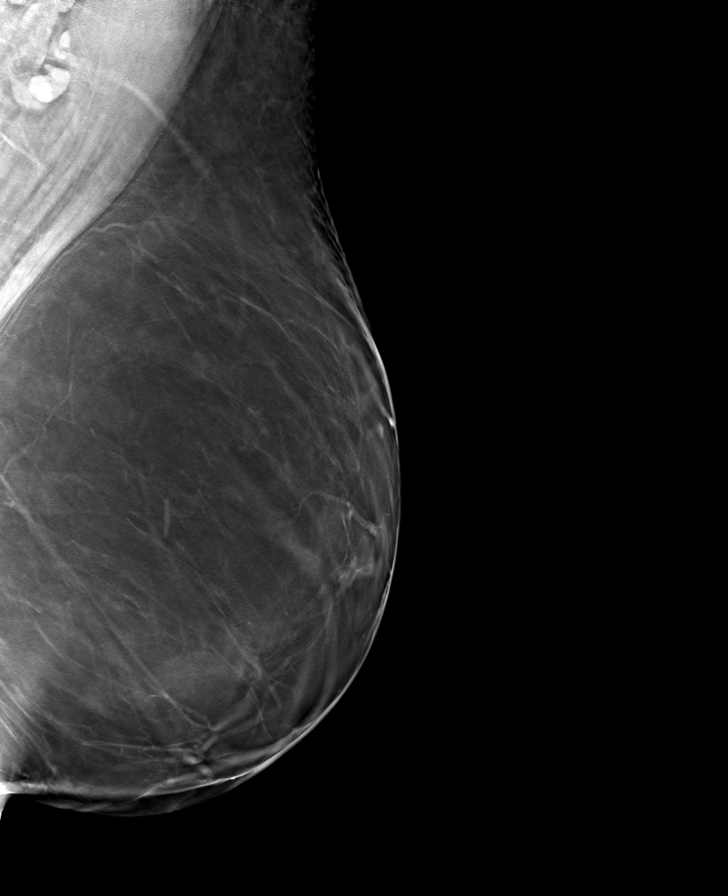

[L CC tomo · tomo slice 37/74.0]
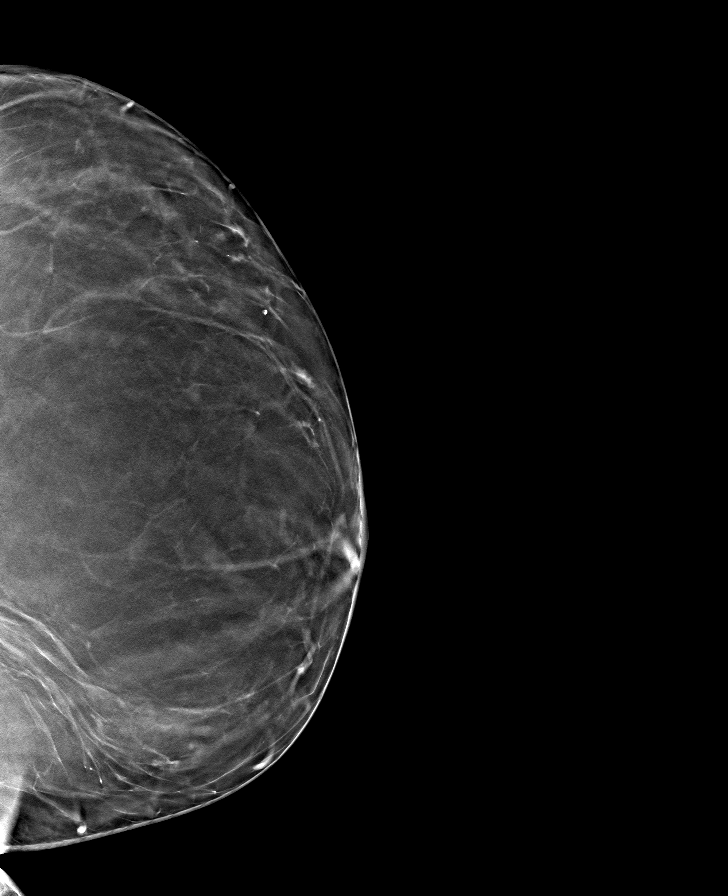

[8 of 24 positions shown; findings below may reference images not displayed]

ACR Breast Density Category b: There are scattered areas of
fibroglandular density.
FINDINGS: There are no findings suspicious for malignancy. Images were
processed with CAD.
IMPRESSION: No mammographic evidence of malignancy. A result letter of this
screening mammogram will be mailed directly to the patient.

RECOMMENDATION:
Screening mammogram in one year. (Code:[id number])

BI-RADS CATEGORY  1: Negative.

## 2020-01-04 DIAGNOSIS — E089 Diabetes mellitus due to underlying condition without complications: Secondary | ICD-10-CM | POA: Diagnosis not present

## 2020-01-04 DIAGNOSIS — E119 Type 2 diabetes mellitus without complications: Secondary | ICD-10-CM | POA: Diagnosis not present

## 2020-01-04 DIAGNOSIS — H524 Presbyopia: Secondary | ICD-10-CM | POA: Diagnosis not present

## 2020-01-10 DIAGNOSIS — I10 Essential (primary) hypertension: Secondary | ICD-10-CM | POA: Diagnosis not present

## 2020-01-10 DIAGNOSIS — E1169 Type 2 diabetes mellitus with other specified complication: Secondary | ICD-10-CM | POA: Diagnosis not present

## 2020-02-11 DIAGNOSIS — H109 Unspecified conjunctivitis: Secondary | ICD-10-CM | POA: Diagnosis not present

## 2020-02-11 DIAGNOSIS — Z03818 Encounter for observation for suspected exposure to other biological agents ruled out: Secondary | ICD-10-CM | POA: Diagnosis not present

## 2020-02-11 DIAGNOSIS — J019 Acute sinusitis, unspecified: Secondary | ICD-10-CM | POA: Diagnosis not present

## 2020-07-20 DIAGNOSIS — I10 Essential (primary) hypertension: Secondary | ICD-10-CM | POA: Diagnosis not present

## 2020-07-20 DIAGNOSIS — E782 Mixed hyperlipidemia: Secondary | ICD-10-CM | POA: Diagnosis not present

## 2020-07-20 DIAGNOSIS — E1169 Type 2 diabetes mellitus with other specified complication: Secondary | ICD-10-CM | POA: Diagnosis not present

## 2020-07-20 DIAGNOSIS — E669 Obesity, unspecified: Secondary | ICD-10-CM | POA: Diagnosis not present

## 2020-07-20 DIAGNOSIS — Z0000 Encounter for general adult medical examination without abnormal findings: Secondary | ICD-10-CM | POA: Diagnosis not present

## 2020-10-09 ENCOUNTER — Telehealth: Payer: Self-pay | Admitting: *Deleted

## 2020-10-09 NOTE — Telephone Encounter (Signed)
Pt complains of vaginal discharge, vaginal odor, vaginal itching. Patient states she started taken Vania Rea was told this may cause yeast infection. I will route to Provider for recommendations. Patient aware office visit maybe required.

## 2020-10-10 DIAGNOSIS — R102 Pelvic and perineal pain: Secondary | ICD-10-CM | POA: Diagnosis not present

## 2020-10-10 DIAGNOSIS — R300 Dysuria: Secondary | ICD-10-CM | POA: Diagnosis not present

## 2020-10-11 MED ORDER — FLUCONAZOLE 150 MG PO TABS
150.0000 mg | ORAL_TABLET | Freq: Every day | ORAL | 0 refills | Status: DC
Start: 2020-10-11 — End: 2020-11-16

## 2020-10-11 NOTE — Telephone Encounter (Signed)
Lauren Bruins, MD sent to Lauren Hale, RMA Caller: Unspecified (2 days ago,  1:55 PM) Agree with Fluconazole or Terazol 3 at patient's preference.  If no improvement, needs a visit.

## 2020-10-11 NOTE — Telephone Encounter (Signed)
Patient aware of recommendations. Diflucan sent into pharmacy

## 2020-11-05 DIAGNOSIS — U071 COVID-19: Secondary | ICD-10-CM | POA: Diagnosis not present

## 2020-11-05 DIAGNOSIS — Z03818 Encounter for observation for suspected exposure to other biological agents ruled out: Secondary | ICD-10-CM | POA: Diagnosis not present

## 2020-11-16 ENCOUNTER — Ambulatory Visit (INDEPENDENT_AMBULATORY_CARE_PROVIDER_SITE_OTHER): Payer: Federal, State, Local not specified - PPO | Admitting: Obstetrics & Gynecology

## 2020-11-16 ENCOUNTER — Encounter: Payer: Self-pay | Admitting: Obstetrics & Gynecology

## 2020-11-16 ENCOUNTER — Other Ambulatory Visit: Payer: Self-pay

## 2020-11-16 ENCOUNTER — Other Ambulatory Visit: Payer: Self-pay | Admitting: Family Medicine

## 2020-11-16 VITALS — BP 120/80 | HR 78 | Resp 16 | Ht 62.75 in | Wt 217.0 lb

## 2020-11-16 DIAGNOSIS — Z01419 Encounter for gynecological examination (general) (routine) without abnormal findings: Secondary | ICD-10-CM | POA: Diagnosis not present

## 2020-11-16 DIAGNOSIS — B372 Candidiasis of skin and nail: Secondary | ICD-10-CM

## 2020-11-16 DIAGNOSIS — N898 Other specified noninflammatory disorders of vagina: Secondary | ICD-10-CM

## 2020-11-16 DIAGNOSIS — Z6838 Body mass index (BMI) 38.0-38.9, adult: Secondary | ICD-10-CM

## 2020-11-16 DIAGNOSIS — Z3041 Encounter for surveillance of contraceptive pills: Secondary | ICD-10-CM

## 2020-11-16 DIAGNOSIS — Z1231 Encounter for screening mammogram for malignant neoplasm of breast: Secondary | ICD-10-CM

## 2020-11-16 MED ORDER — NORETHINDRONE 0.35 MG PO TABS: 1.0000 | ORAL_TABLET | Freq: Every day | ORAL | 4 refills | Status: DC

## 2020-11-16 MED ORDER — NYSTATIN 100000 UNIT/GM EX POWD
Freq: Two times a day (BID) | CUTANEOUS | 4 refills | Status: DC
Start: 2020-11-16 — End: 2021-05-17

## 2020-11-16 NOTE — Progress Notes (Signed)
Lauren Hale 01-10-1971 [id number]   History:    50 y.o.  G0 Engaged   RP:  Established patient presenting for annual gyn exam    HPI: Well on the Progestin-only pill with light menses.  No BTB.  No hot flushes or night sweats. No pelvic pain.  H/O Myomectomies.  No pain with IC, but vaginal dryness and decreased libido.  Urine/BMs wnl.  Yeast of skin in folds, well using Nystatin powder. Breasts s/p reduction, normal.  BMI increased to 38.75.  Starting back trying to loose weight. S/P Bariatric surgery.  Health labs with Fam MD.    Past medical history,surgical history, family history and social history were all reviewed and documented in the EPIC chart.  Gynecologic History No LMP recorded. (Menstrual status: Oral contraceptives).  Obstetric History OB History  Gravida Para Term Preterm AB Living  0 0 0 0 0 0  SAB IAB Ectopic Multiple Live Births  0 0 0 0 0     ROS: A ROS was performed and pertinent positives and negatives are included in the history.  GENERAL: No fevers or chills. HEENT: No change in vision, no earache, sore throat or sinus congestion. NECK: No pain or stiffness. CARDIOVASCULAR: No chest pain or pressure. No palpitations. PULMONARY: No shortness of breath, cough or wheeze. GASTROINTESTINAL: No abdominal pain, nausea, vomiting or diarrhea, melena or bright red blood per rectum. GENITOURINARY: No urinary frequency, urgency, hesitancy or dysuria. MUSCULOSKELETAL: No joint or muscle pain, no back pain, no recent trauma. DERMATOLOGIC: No rash, no itching, no lesions. ENDOCRINE: No polyuria, polydipsia, no heat or cold intolerance. No recent change in weight. HEMATOLOGICAL: No anemia or easy bruising or bleeding. NEUROLOGIC: No headache, seizures, numbness, tingling or weakness. PSYCHIATRIC: No depression, no loss of interest in normal activity or change in sleep pattern.     Exam:   BP 120/80   Pulse 78   Resp 16   Ht 5' 2.75" (1.594 m)   Wt 217 lb  (98.4 kg)   BMI 38.75 kg/m   Body mass index is 38.75 kg/m.  General appearance : Well developed well nourished female. No acute distress HEENT: Eyes: no retinal hemorrhage or exudates,  Neck supple, trachea midline, no carotid bruits, no thyroidmegaly Lungs: Clear to auscultation, no rhonchi or wheezes, or rib retractions  Heart: Regular rate and rhythm, no murmurs or gallops Breast:Examined in sitting and supine position were symmetrical in appearance, no palpable masses or tenderness,  no skin retraction, no nipple inversion, no nipple discharge, no skin discoloration, no axillary or supraclavicular lymphadenopathy Abdomen: no palpable masses or tenderness, no rebound or guarding Extremities: no edema or skin discoloration or tenderness  Pelvic: Vulva: Normal             Vagina: No gross lesions or discharge  Cervix: No gross lesions or discharge  Uterus  AV, normal size, shape and consistency, non-tender and mobile  Adnexa  Without masses or tenderness  Anus: Normal   Assessment/Plan:  50 y.o. female for annual exam   1. Well female exam with routine gynecological exam Normal gynecologic exam.  Pap test negative in 2020, will repeat at 3 years.  Breast exam normal.  Will schedule screening mammogram at the breast center now.  Colonoscopy due in 2023.  Health labs with family physician.  2. Encounter for surveillance of contraceptive pills Well on the progestin only pill.  No contraindication to continue.  Prescription sent to pharmacy.  3. Yeast infection of the  skin Controlled on the statin powder.  No contraindication to continue.  Prescription sent to pharmacy.    4. Vaginal dryness Coconut oil recommended for intercourse.  Counseling done on decreased libido.  Recommend Phyto-estrogens.  5. Class 2 severe obesity due to excess calories with serious comorbidity and body mass index (BMI) of 38.0 to 38.9 in adult [facility]) Status post bariatric surgery.  Recommend a lower  calorie/carb diet.  Encouraged to increase fitness activities.  Other orders - fluticasone (FLONASE) 50 MCG/ACT nasal spray; Place 1 spray into both nostrils 2 (two) times daily. - NOVOFINE PLUS PEN NEEDLE 32G X 4 MM MISC; Inject into the skin daily. - nystatin (MYCOSTATIN/NYSTOP) powder; Apply topically 2 (two) times daily. - norethindrone (MICRONOR) 0.35 MG tablet; Take 1 tablet (0.35 mg total) by mouth daily.   Princess Bruins MD, 8:15 AM 11/16/2020

## 2020-11-17 ENCOUNTER — Ambulatory Visit
Admission: RE | Admit: 2020-11-17 | Discharge: 2020-11-17 | Disposition: A | Discharge status: Home / Self Care | Payer: Federal, State, Local not specified - PPO | Source: Ambulatory Visit | Attending: Family Medicine | Admitting: Family Medicine

## 2020-11-17 DIAGNOSIS — Z1231 Encounter for screening mammogram for malignant neoplasm of breast: Secondary | ICD-10-CM | POA: Diagnosis not present

## 2021-01-30 DIAGNOSIS — I10 Essential (primary) hypertension: Secondary | ICD-10-CM | POA: Diagnosis not present

## 2021-01-30 DIAGNOSIS — Z6836 Body mass index (BMI) 36.0-36.9, adult: Secondary | ICD-10-CM | POA: Diagnosis not present

## 2021-01-30 DIAGNOSIS — E1169 Type 2 diabetes mellitus with other specified complication: Secondary | ICD-10-CM | POA: Diagnosis not present

## 2021-03-08 DIAGNOSIS — Z800 Family history of malignant neoplasm of digestive organs: Secondary | ICD-10-CM | POA: Diagnosis not present

## 2021-03-08 DIAGNOSIS — Z8371 Family history of colonic polyps: Secondary | ICD-10-CM | POA: Diagnosis not present

## 2021-03-08 DIAGNOSIS — K5900 Constipation, unspecified: Secondary | ICD-10-CM | POA: Diagnosis not present

## 2021-03-08 DIAGNOSIS — Z1211 Encounter for screening for malignant neoplasm of colon: Secondary | ICD-10-CM | POA: Diagnosis not present

## 2021-05-17 ENCOUNTER — Encounter: Payer: Self-pay | Admitting: Obstetrics & Gynecology

## 2021-05-17 ENCOUNTER — Ambulatory Visit: Payer: Federal, State, Local not specified - PPO | Admitting: Obstetrics & Gynecology

## 2021-05-17 VITALS — BP 110/76

## 2021-05-17 DIAGNOSIS — R399 Unspecified symptoms and signs involving the genitourinary system: Secondary | ICD-10-CM | POA: Diagnosis not present

## 2021-05-17 DIAGNOSIS — N9089 Other specified noninflammatory disorders of vulva and perineum: Secondary | ICD-10-CM | POA: Diagnosis not present

## 2021-05-17 DIAGNOSIS — B372 Candidiasis of skin and nail: Secondary | ICD-10-CM

## 2021-05-17 LAB — URINALYSIS, COMPLETE W/RFL CULTURE
Bacteria, UA: NONE SEEN /HPF
Bilirubin Urine: NEGATIVE
Calcium Oxalate Crystal: NONE SEEN /HPF
Casts: NONE SEEN /LPF
Crystals: NONE SEEN /HPF
Glucose, UA: NEGATIVE
Hgb urine dipstick: NEGATIVE
Hyaline Cast: NONE SEEN /LPF
Ketones, ur: NEGATIVE
Leukocyte Esterase: NEGATIVE
Nitrites, Initial: NEGATIVE
Protein, ur: NEGATIVE
RBC / HPF: NONE SEEN /HPF (ref 0–2)
Specific Gravity, Urine: 1.01 (ref 1.001–1.035)
WBC, UA: NONE SEEN /HPF (ref 0–5)
Yeast: NONE SEEN /HPF
pH: 6 (ref 5.0–8.0)

## 2021-05-17 LAB — WET PREP FOR TRICH, YEAST, CLUE

## 2021-05-17 LAB — NO CULTURE INDICATED

## 2021-05-17 MED ORDER — FLUCONAZOLE 150 MG PO TABS: 150.0000 mg | ORAL_TABLET | ORAL | 3 refills | Status: AC

## 2021-05-17 MED ORDER — NYSTATIN 100000 UNIT/GM EX POWD: Freq: Two times a day (BID) | CUTANEOUS | 4 refills | Status: DC

## 2021-05-17 NOTE — Progress Notes (Signed)
? ? ?  Lauren Hale [birth year] [id number] ? ? ?     51 y.o.  G0 Engaged ? ?RP: Vulvar irritation and yeast of skin folds ? ?HPI: Yeast of skin in folds at the abdomen and inguinal areas, using Nystatin powder.  Vulva is irritated as well. Used OTC Miconazole cream with a little improvement.  Didn't notice any increased vaginal discharge.  Well on the Progestin-only pill with light menses.  No BTB. No pelvic pain.  No pain with IC.  BMs wnl. S/P Bariatric surgery.  Last BMI 38.75 in 11/2020.   ? ?OB History  ?Gravida Para Term Preterm AB Living  ?0 0 0 0 0 0  ?SAB IAB Ectopic Multiple Live Births  ?0 0 0 0 0  ? ? ?Past medical history,surgical history, problem list, medications, allergies, family history and social history were all reviewed and documented in the EPIC chart. ? ? ?Directed ROS with pertinent positives and negatives documented in the history of present illness/assessment and plan. ? ?Exam: ? ?There were no vitals filed for this visit. ?General appearance:  Normal ? ?Abdomen: Mild irritation with erythema at skin folds ? ?Gynecologic exam: Vulva:  Mild irritation with erythema all over, including the inguinal areas.  Speculum:  Vagina/cervix normal.  Normal secretions.  Wet prep done. ? ?Wet prep: Negative ?U/A:  Completely Negative ? ? ?Assessment/Plan:  51 y.o. G0 ? ?1. Vulvar irritation ?Yeast vulvitis clinically.  Wet prep Negative vaginally.  Will treat with Fluconazole 150 mg 1 tab PO every other day x 3.  Usage reviewed and prescription sent to pharmacy. ?- WET PREP FOR TRICH, YEAST, CLUE ? ?2. Yeast infection of the skin ?Continue with Nystatin powder.  Prescription sent to pharmacy.  Recommend low Carb diet/weight loss.  Probiotic and yogourt.   ? ?3. Urinary tract infection symptoms ?U/A Negative, reassured. ?- Urinalysis,Complete w/RFL Culture ? ?Other orders ?- OZEMPIC, 0.25 OR 0.5 MG/DOSE, 2 MG/1.5ML SOPN; Inject 0.5 mg into the skin once a week. ?- Zinc Oxide (DESITIN EX); Apply  topically. ?- fluconazole (DIFLUCAN) 150 MG tablet; Take 1 tablet (150 mg total) by mouth every other day for 3 doses. ?- nystatin (MYCOSTATIN/NYSTOP) powder; Apply topically 2 (two) times daily. ?- REFLEXIVE URINE CULTURE  ? ?Princess Bruins MD, 10:39 AM 05/17/2021 ? ? ? ?  ?

## 2021-05-18 ENCOUNTER — Ambulatory Visit: Payer: Federal, State, Local not specified - PPO | Admitting: Obstetrics & Gynecology

## 2021-08-20 DIAGNOSIS — E559 Vitamin D deficiency, unspecified: Secondary | ICD-10-CM | POA: Diagnosis not present

## 2021-08-20 DIAGNOSIS — E1169 Type 2 diabetes mellitus with other specified complication: Secondary | ICD-10-CM | POA: Diagnosis not present

## 2021-08-20 DIAGNOSIS — E782 Mixed hyperlipidemia: Secondary | ICD-10-CM | POA: Diagnosis not present

## 2021-08-20 DIAGNOSIS — I10 Essential (primary) hypertension: Secondary | ICD-10-CM | POA: Diagnosis not present

## 2021-08-20 DIAGNOSIS — E669 Obesity, unspecified: Secondary | ICD-10-CM | POA: Diagnosis not present

## 2021-08-20 DIAGNOSIS — Z0000 Encounter for general adult medical examination without abnormal findings: Secondary | ICD-10-CM | POA: Diagnosis not present

## 2021-10-16 ENCOUNTER — Other Ambulatory Visit: Payer: Self-pay | Admitting: Family Medicine

## 2021-10-16 DIAGNOSIS — Z1231 Encounter for screening mammogram for malignant neoplasm of breast: Secondary | ICD-10-CM

## 2021-11-20 ENCOUNTER — Ambulatory Visit
Admission: RE | Admit: 2021-11-20 | Discharge: 2021-11-20 | Disposition: A | Discharge status: Home / Self Care | Payer: Federal, State, Local not specified - PPO | Source: Ambulatory Visit | Attending: Family Medicine | Admitting: Family Medicine

## 2021-11-20 DIAGNOSIS — Z1231 Encounter for screening mammogram for malignant neoplasm of breast: Secondary | ICD-10-CM

## 2021-11-22 ENCOUNTER — Ambulatory Visit (INDEPENDENT_AMBULATORY_CARE_PROVIDER_SITE_OTHER): Payer: Federal, State, Local not specified - PPO | Admitting: Obstetrics & Gynecology

## 2021-11-22 ENCOUNTER — Other Ambulatory Visit (HOSPITAL_COMMUNITY)
Admission: RE | Admit: 2021-11-22 | Discharge: 2021-11-22 | Disposition: A | Discharge status: Home / Self Care | Payer: Federal, State, Local not specified - PPO | Source: Ambulatory Visit | Attending: Obstetrics & Gynecology | Admitting: Obstetrics & Gynecology

## 2021-11-22 ENCOUNTER — Encounter: Payer: Self-pay | Admitting: Obstetrics & Gynecology

## 2021-11-22 VITALS — BP 114/70 | HR 91 | Ht 63.25 in | Wt 206.0 lb

## 2021-11-22 DIAGNOSIS — Z01419 Encounter for gynecological examination (general) (routine) without abnormal findings: Secondary | ICD-10-CM | POA: Diagnosis not present

## 2021-11-22 DIAGNOSIS — Z3041 Encounter for surveillance of contraceptive pills: Secondary | ICD-10-CM

## 2021-11-22 MED ORDER — NYSTATIN 100000 UNIT/GM EX POWD: Freq: Two times a day (BID) | CUTANEOUS | 4 refills | Status: DC

## 2021-11-22 MED ORDER — NORETHINDRONE 0.35 MG PO TABS: 1.0000 | ORAL_TABLET | Freq: Every day | ORAL | 4 refills | Status: DC

## 2021-11-22 NOTE — Progress Notes (Signed)
Lauren Hale July 08, 1970 [id number]   History:    51 y.o.  G0 Engaged   RP:  Established patient presenting for annual gyn exam    HPI: Well on the Progestin-only pill with light menses.  No BTB.  No hot flushes or night sweats. No pelvic pain. No pain with IC.  Pap Neg 10/2018.  Pap reflex today. H/O Myomectomies.  No pain with IC, but vaginal dryness and decreased libido.  Urine/BMs wnl.  Yeast of skin in folds, well using Nystatin powder. Breasts s/p reduction, normal.  Mammo Neg 11/2020.  BMI decreased to 36.2.  Losing weight on Ozempic. S/P Bariatric surgery.  Health labs with Fam MD. Flu vaccine done at [facility].   Past medical history,surgical history, family history and social history were all reviewed and documented in the EPIC chart.  Gynecologic History No LMP recorded. (Menstrual status: Oral contraceptives).  Obstetric History OB History  Gravida Para Term Preterm AB Living  0 0 0 0 0 0  SAB IAB Ectopic Multiple Live Births  0 0 0 0 0     ROS: A ROS was performed and pertinent positives and negatives are included in the history. GENERAL: No fevers or chills. HEENT: No change in vision, no earache, sore throat or sinus congestion. NECK: No pain or stiffness. CARDIOVASCULAR: No chest pain or pressure. No palpitations. PULMONARY: No shortness of breath, cough or wheeze. GASTROINTESTINAL: No abdominal pain, nausea, vomiting or diarrhea, melena or bright red blood per rectum. GENITOURINARY: No urinary frequency, urgency, hesitancy or dysuria. MUSCULOSKELETAL: No joint or muscle pain, no back pain, no recent trauma. DERMATOLOGIC: No rash, no itching, no lesions. ENDOCRINE: No polyuria, polydipsia, no heat or cold intolerance. No recent change in weight. HEMATOLOGICAL: No anemia or easy bruising or bleeding. NEUROLOGIC: No headache, seizures, numbness, tingling or weakness. PSYCHIATRIC: No depression, no loss of interest in normal activity or change in sleep pattern.      Exam:   BP 114/70   Pulse 91   Ht 5' 3.25" (1.607 m)   Wt 206 lb (93.4 kg)   SpO2 99%   BMI 36.20 kg/m   Body mass index is 36.2 kg/m.  General appearance : Well developed well nourished female. No acute distress HEENT: Eyes: no retinal hemorrhage or exudates,  Neck supple, trachea midline, no carotid bruits, no thyroidmegaly Lungs: Clear to auscultation, no rhonchi or wheezes, or rib retractions  Heart: Regular rate and rhythm, no murmurs or gallops Breast:Examined in sitting and supine position were symmetrical in appearance, no palpable masses or tenderness,  no skin retraction, no nipple inversion, no nipple discharge, no skin discoloration, no axillary or supraclavicular lymphadenopathy Abdomen: no palpable masses or tenderness, no rebound or guarding Extremities: no edema or skin discoloration or tenderness  Pelvic: Vulva: Normal             Vagina: No gross lesions or discharge  Cervix: No gross lesions or discharge.  Pap reflex done.  Uterus  AV, normal size, shape and consistency, non-tender and mobile  Adnexa  Without masses or tenderness  Anus: Normal   Assessment/Plan:  51 y.o. female for annual exam   1. Encounter for routine gynecological examination with Papanicolaou smear of cervix Well on the Progestin-only pill with light menses.  No BTB.  No hot flushes or night sweats. No pelvic pain. No pain with IC.  Pap Neg 10/2018.  Pap reflex today. H/O Myomectomies.  No pain with IC, but vaginal dryness and decreased libido.  Urine/BMs wnl.  Yeast of skin in folds, well using Nystatin powder. Breasts s/p reduction, normal.  Mammo Neg 11/2020.  BMI decreased to 36.2.  Losing weight on Ozempic. S/P Bariatric surgery.  Health labs with Fam MD. Flu vaccine done at [facility]. - Cytology - PAP( [place])  2. Encounter for surveillance of contraceptive pills Well on the Progestin-only pill with light menses.  No BTB.  No hot flushes or night sweats. No pelvic pain. No pain  with IC.  3. Class 2 severe obesity due to excess calories with serious comorbidity and body mass index (BMI) of 36.0 to 36.9 in adult ([facility]CC) BMI decreased to 36.2.  Losing weight on Ozempic.  S/P Bariatric surgery.   Other orders - OZEMPIC, 1 MG/DOSE, 4 MG/3ML SOPN; Inject 1 mg into the skin once a week. - nystatin (MYCOSTATIN/NYSTOP) powder; Apply topically 2 (two) times daily. - norethindrone (MICRONOR) 0.35 MG tablet; Take 1 tablet (0.35 mg total) by mouth daily.   Princess Bruins MD, 8:15 AM 11/22/2021

## 2021-11-23 LAB — CYTOLOGY - PAP: Diagnosis: NEGATIVE

## 2021-11-30 DIAGNOSIS — J069 Acute upper respiratory infection, unspecified: Secondary | ICD-10-CM | POA: Diagnosis not present

## 2021-11-30 DIAGNOSIS — R059 Cough, unspecified: Secondary | ICD-10-CM | POA: Diagnosis not present

## 2022-01-10 ENCOUNTER — Telehealth: Payer: Self-pay | Admitting: *Deleted

## 2022-01-10 NOTE — Telephone Encounter (Signed)
Patient dropped PA form off for nystatin powder, from completed and faxed back to [phone]. Pending response.

## 2022-01-16 ENCOUNTER — Telehealth: Payer: Self-pay

## 2022-01-16 NOTE — Telephone Encounter (Signed)
I spoke with patient and informed her that we received correspondence from [facility] and no PA is required for her Nystatin Powder.  Copy of correspondence will be sent to med records to be scanned into chart.

## 2022-01-17 NOTE — Telephone Encounter (Signed)
Per encounter on 01/16/2022: "I spoke with patient and informed her that we received correspondence from [facility] and no PA is required for her Nystatin Powder.   Copy of correspondence will be sent to med records to be scanned into chart."

## 2022-02-20 DIAGNOSIS — E1165 Type 2 diabetes mellitus with hyperglycemia: Secondary | ICD-10-CM | POA: Diagnosis not present

## 2022-02-20 DIAGNOSIS — M2550 Pain in unspecified joint: Secondary | ICD-10-CM | POA: Diagnosis not present

## 2022-02-20 DIAGNOSIS — I10 Essential (primary) hypertension: Secondary | ICD-10-CM | POA: Diagnosis not present

## 2022-02-20 DIAGNOSIS — E782 Mixed hyperlipidemia: Secondary | ICD-10-CM | POA: Diagnosis not present

## 2022-04-08 DIAGNOSIS — L578 Other skin changes due to chronic exposure to nonionizing radiation: Secondary | ICD-10-CM | POA: Diagnosis not present

## 2022-04-08 DIAGNOSIS — L858 Other specified epidermal thickening: Secondary | ICD-10-CM | POA: Diagnosis not present

## 2022-04-08 DIAGNOSIS — L680 Hirsutism: Secondary | ICD-10-CM | POA: Diagnosis not present

## 2022-09-04 DIAGNOSIS — E119 Type 2 diabetes mellitus without complications: Secondary | ICD-10-CM | POA: Diagnosis not present

## 2022-09-04 DIAGNOSIS — Z0000 Encounter for general adult medical examination without abnormal findings: Secondary | ICD-10-CM | POA: Diagnosis not present

## 2022-09-04 DIAGNOSIS — E559 Vitamin D deficiency, unspecified: Secondary | ICD-10-CM | POA: Diagnosis not present

## 2022-09-04 DIAGNOSIS — E782 Mixed hyperlipidemia: Secondary | ICD-10-CM | POA: Diagnosis not present

## 2022-09-04 DIAGNOSIS — I10 Essential (primary) hypertension: Secondary | ICD-10-CM | POA: Diagnosis not present

## 2022-09-26 ENCOUNTER — Other Ambulatory Visit: Payer: Self-pay | Admitting: Obstetrics & Gynecology

## 2022-09-26 NOTE — Telephone Encounter (Signed)
Med refill request: Nystatin powder Last AEX: 11/22/21 Next AEX: 11/27/22 GH Last MMG (if hormonal med) 11/20/21 Refill authorized: Please Advise, #60g, 2RF

## 2022-10-09 ENCOUNTER — Other Ambulatory Visit: Payer: Self-pay | Admitting: Family Medicine

## 2022-10-09 DIAGNOSIS — Z1231 Encounter for screening mammogram for malignant neoplasm of breast: Secondary | ICD-10-CM

## 2022-11-26 ENCOUNTER — Ambulatory Visit: Payer: Federal, State, Local not specified - PPO

## 2022-11-27 ENCOUNTER — Ambulatory Visit: Payer: Federal, State, Local not specified - PPO | Admitting: Obstetrics and Gynecology

## 2022-11-27 ENCOUNTER — Ambulatory Visit: Payer: Federal, State, Local not specified - PPO | Admitting: Obstetrics & Gynecology

## 2022-11-28 ENCOUNTER — Other Ambulatory Visit: Payer: Self-pay | Admitting: Family Medicine

## 2022-11-28 DIAGNOSIS — N644 Mastodynia: Secondary | ICD-10-CM

## 2022-12-05 ENCOUNTER — Ambulatory Visit: Payer: Federal, State, Local not specified - PPO

## 2022-12-18 ENCOUNTER — Ambulatory Visit: Payer: Federal, State, Local not specified - PPO

## 2022-12-18 ENCOUNTER — Ambulatory Visit
Admission: RE | Admit: 2022-12-18 | Discharge: 2022-12-18 | Disposition: A | Discharge status: Home / Self Care | Payer: Federal, State, Local not specified - PPO | Source: Ambulatory Visit | Attending: Family Medicine | Admitting: Family Medicine

## 2022-12-18 DIAGNOSIS — N644 Mastodynia: Secondary | ICD-10-CM | POA: Diagnosis not present

## 2022-12-23 ENCOUNTER — Encounter: Payer: Self-pay | Admitting: Obstetrics and Gynecology

## 2022-12-23 ENCOUNTER — Ambulatory Visit (INDEPENDENT_AMBULATORY_CARE_PROVIDER_SITE_OTHER): Payer: Federal, State, Local not specified - PPO | Admitting: Obstetrics and Gynecology

## 2022-12-23 VITALS — BP 118/76 | HR 66 | Ht 63.75 in | Wt 201.0 lb

## 2022-12-23 DIAGNOSIS — N952 Postmenopausal atrophic vaginitis: Secondary | ICD-10-CM

## 2022-12-23 DIAGNOSIS — N958 Other specified menopausal and perimenopausal disorders: Secondary | ICD-10-CM | POA: Insufficient documentation

## 2022-12-23 DIAGNOSIS — Z3041 Encounter for surveillance of contraceptive pills: Secondary | ICD-10-CM

## 2022-12-23 DIAGNOSIS — Z01419 Encounter for gynecological examination (general) (routine) without abnormal findings: Secondary | ICD-10-CM | POA: Diagnosis not present

## 2022-12-23 DIAGNOSIS — B372 Candidiasis of skin and nail: Secondary | ICD-10-CM

## 2022-12-23 MED ORDER — NORETHINDRONE 0.35 MG PO TABS: 1.0000 | ORAL_TABLET | Freq: Every day | ORAL | 4 refills | Status: DC

## 2022-12-23 MED ORDER — ESTRADIOL 0.1 MG/GM VA CREA: TOPICAL_CREAM | VAGINAL | 1 refills | Status: DC

## 2022-12-23 MED ORDER — NYSTATIN 100000 UNIT/GM EX POWD: Freq: Two times a day (BID) | CUTANEOUS | 2 refills | Status: DC

## 2022-12-23 NOTE — Patient Instructions (Signed)
Considering applying coconut oil to the vulva after showers. You could also using daily vaginal moisturizers by brands like Good Clean Love and Ah! Yes.  Health Maintenance, Female Adopting a healthy lifestyle and getting preventive care are important in promoting health and wellness. Ask your health care provider about: The right schedule for you to have regular tests and exams. Things you can do on your own to prevent diseases and keep yourself healthy. What should I know about diet, weight, and exercise? Eat a healthy diet  Eat a diet that includes plenty of vegetables, fruits, low-fat dairy products, and lean protein. Do not eat a lot of foods that are high in solid fats, added sugars, or sodium. Maintain a healthy weight Body mass index (BMI) is used to identify weight problems. It estimates body fat based on height and weight. Your health care provider can help determine your BMI and help you achieve or maintain a healthy weight. Get regular exercise Get regular exercise. This is one of the most important things you can do for your health. Most adults should: Exercise for at least 150 minutes each week. The exercise should increase your heart rate and make you sweat (moderate-intensity exercise). Do strengthening exercises at least twice a week. This is in addition to the moderate-intensity exercise. Spend less time sitting. Even light physical activity can be beneficial. Watch cholesterol and blood lipids Have your blood tested for lipids and cholesterol at 51 years of age, then have this test every 5 years. Have your cholesterol levels checked more often if: Your lipid or cholesterol levels are high. You are older than 51 years of age. You are at high risk for heart disease. What should I know about cancer screening? Depending on your health history and family history, you may need to have cancer screening at various ages. This may include screening for: Breast cancer. Cervical  cancer. Colorectal cancer. Skin cancer. Lung cancer. What should I know about heart disease, diabetes, and high blood pressure? Blood pressure and heart disease High blood pressure causes heart disease and increases the risk of stroke. This is more likely to develop in people who have high blood pressure readings or are overweight. Have your blood pressure checked: Every 3-5 years if you are 17-60 years of age. Every year if you are 60 years old or older. Diabetes Have regular diabetes screenings. This checks your fasting blood sugar level. Have the screening done: Once every three years after age 68 if you are at a normal weight and have a low risk for diabetes. More often and at a younger age if you are overweight or have a high risk for diabetes. What should I know about preventing infection? Hepatitis B If you have a higher risk for hepatitis B, you should be screened for this virus. Talk with your health care provider to find out if you are at risk for hepatitis B infection. Hepatitis C Testing is recommended for: Everyone born from [birth year] through 1964. Anyone with known risk factors for hepatitis C. Sexually transmitted infections (STIs) Get screened for STIs, including gonorrhea and chlamydia, if: You are sexually active and are younger than 51 years of age. You are older than 51 years of age and your health care provider tells you that you are at risk for this type of infection. Your sexual activity has changed since you were last screened, and you are at increased risk for chlamydia or gonorrhea. Ask your health care provider if you are at risk. Ask  your health care provider about whether you are at high risk for HIV. Your health care provider may recommend a prescription medicine to help prevent HIV infection. If you choose to take medicine to prevent HIV, you should first get tested for HIV. You should then be tested every 3 months for as long as you are taking the  medicine. Pregnancy If you are about to stop having your period (premenopausal) and you may become pregnant, seek counseling before you get pregnant. Take 400 to 800 micrograms (mcg) of folic acid every day if you become pregnant. Ask for birth control (contraception) if you want to prevent pregnancy. Osteoporosis and menopause Osteoporosis is a disease in which the bones lose minerals and strength with aging. This can result in bone fractures. If you are 73 years old or older, or if you are at risk for osteoporosis and fractures, ask your health care provider if you should: Be screened for bone loss. Take a calcium or vitamin D supplement to lower your risk of fractures. Be given hormone replacement therapy (HRT) to treat symptoms of menopause. Follow these instructions at home: Alcohol use Do not drink alcohol if: Your health care provider tells you not to drink. You are pregnant, may be pregnant, or are planning to become pregnant. If you drink alcohol: Limit how much you have to: 0-1 drink a day. Know how much alcohol is in your drink. In the [place] one drink equals one 12 oz bottle of beer (355 mL), one 5 oz glass of wine (148 mL), or one 1 oz glass of hard liquor (44 mL). Lifestyle Do not use any products that contain nicotine or tobacco. These products include cigarettes, chewing tobacco, and vaping devices, such as e-cigarettes. If you need help quitting, ask your health care provider. Do not use street drugs. Do not share needles. Ask your health care provider for help if you need support or information about quitting drugs. General instructions Schedule regular health, dental, and eye exams. Stay current with your vaccines. Tell your health care provider if: You often feel depressed. You have ever been abused or do not feel safe at home. Summary Adopting a healthy lifestyle and getting preventive care are important in promoting health and wellness. Follow your health care  provider's instructions about healthy diet, exercising, and getting tested or screened for diseases. Follow your health care provider's instructions on monitoring your cholesterol and blood pressure. This information is not intended to replace advice given to you by your health care provider. Make sure you discuss any questions you have with your health care provider. Document Revised: 05/22/2020 Document Reviewed: 05/22/2020 Elsevier Patient Education  2024 [employer].

## 2022-12-23 NOTE — Assessment & Plan Note (Signed)
 Cervical cancer screening performed according to ASCCP guidelines. Encouraged annual mammogram screening Colonoscopy UTD DXA N/A Labs and immunizations with her primary Encouraged safe sexual practices as indicated Encouraged healthy lifestyle practices with diet and exercise For patients under 50-51yo, I recommend 1200mg  calcium daily and 600IU of vitamin D daily.

## 2022-12-23 NOTE — Progress Notes (Signed)
52 y.o. G0P0000 female on POP with history of fibroids (s/p myomectomy, 2014) and history of breast reduction here for annual exam. Engaged.  No LMP recorded. (Menstrual status: Oral contraceptives).  Notes pain with intercourse and vaginal dryness. Has a skin tag that is uncomfortable.  Abnormal bleeding: none Pelvic discharge or pain: none Breast mass, nipple discharge or skin changes : none, had some left tenderness earlier this month that has completely resolved. Birth control: POP Last PAP:     Component Value Date/Time   DIAGPAP  11/22/2021 0830    - Negative for intraepithelial lesion or malignancy (NILM)   ADEQPAP  11/22/2021 0830    Satisfactory for evaluation; transformation zone component PRESENT.   Last mammogram: 12/18/22 Last colonoscopy: ~5 years ago per patient, Dr. Charna Elizabeth  Sexually active: yes,   Exercising: no Smoking: no  GYN HISTORY: No significant history  OB History  Gravida Para Term Preterm AB Living  0 0 0 0 0 0  SAB IAB Ectopic Multiple Live Births  0 0 0 0 0    Past Medical History:  Diagnosis Date   Diabetes mellitus without complication (HCC)    diet control   GERD (gastroesophageal reflux disease)    Hypertension     Past Surgical History:  Procedure Laterality Date   BREAST BIOPSY     BREAST EXCISIONAL BIOPSY Left    BREAST SURGERY  2001   reduction   CHOLECYSTECTOMY  '90's   LAPAROSCOPIC GASTRIC BANDING  2008   REDUCTION MAMMAPLASTY Bilateral    ROBOT ASSISTED MYOMECTOMY  01/17/2012   Procedure: ROBOTIC ASSISTED MYOMECTOMY;  Surgeon: Genia Del, MD;  Location: [facility];  Service: Gynecology;  Laterality: N/A;    Current Outpatient Medications on File Prior to Visit  Medication Sig Dispense Refill   amLODipine (NORVASC) 5 MG tablet Take 5 mg by mouth daily.     cetirizine (ZYRTEC) 10 MG tablet Take 10 mg by mouth.     [facility]S D3 5000 units capsule Take 5,000 Units by mouth daily.  1   EPINEPHrine 0.3 mg/0.3 mL IJ SOAJ  injection      losartan (COZAAR) 100 MG tablet losartan 100 mg tablet     OZEMPIC, 1 MG/DOSE, 4 MG/3ML SOPN Inject 1 mg into the skin once a week.     No current facility-administered medications on file prior to visit.    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[employer]  Tobacco Use   Smoking status: Never   Smokeless tobacco: Never  Vaping Use   Vaping status: Never Used  Substance and Sexual Activity   Alcohol use: Yes    Comment: occasionally   Drug use: No   Sexual activity: Yes    Partners: Male    Birth control/protection: Pill    Comment: 1st intercourse- 68, partners- 4,  Other Topics Concern   Not on file  Social History Narrative   Not on file   Social Determinants of Health   Financial Resource Strain: Not on file  Food Insecurity: Not on file  Transportation Needs: Not on file  Physical Activity: Not on file  Stress: Not on file  Social Connections: Not on file  Intimate Partner Violence: Not on file    Family  History  Problem Relation Age of Onset   Cancer Sister 74       breast ca, neg testing   Diabetes Sister    Breast cancer Sister 40   Cancer Maternal Uncle        throat ca   Cancer Paternal Aunt        stomach ca   Diabetes Paternal Aunt    Cancer Paternal Uncle        colon ca   Diabetes Paternal Uncle    Cancer Cousin        stomach ca   Cancer Paternal Aunt        breast ca   Diabetes Paternal Aunt    Diabetes Father    Breast cancer Maternal Aunt    Stroke Maternal Grandmother    Cancer Maternal Uncle     Allergies  Allergen Reactions   Glucophage [Metformin]     Other reaction(s): upset stomach   Lisinopril     Other reaction(s): cough present   Nickel     Other reaction(s): Unknown      PE Today's Vitals   12/23/22 0750   BP: 118/76  Pulse: 66  SpO2: 98%  Weight: 201 lb (91.2 kg)  Height: 5' 3.75" (1.619 m)   Body mass index is 34.77 kg/m.  Physical Exam Vitals reviewed. Exam conducted with a chaperone present.  Constitutional:      General: She is not in acute distress.    Appearance: Normal appearance.  HENT:     Head: Normocephalic and atraumatic.     Nose: Nose normal.  Eyes:     Extraocular Movements: Extraocular movements intact.     Conjunctiva/sclera: Conjunctivae normal.  Neck:     Thyroid: No thyroid mass, thyromegaly or thyroid tenderness.  Pulmonary:     Effort: Pulmonary effort is normal.  Chest:     Chest wall: No mass or tenderness.  Breasts:    Right: Normal. No swelling, mass, nipple discharge, skin change or tenderness.     Left: Normal. No swelling, mass, nipple discharge, skin change or tenderness.     Comments: Bilateral mammoplasty scars Abdominal:     General: There is no distension.     Palpations: Abdomen is soft.     Tenderness: There is no abdominal tenderness.  Genitourinary:    General: Normal vulva.     Exam position: Lithotomy position.     Urethra: No prolapse.     Vagina: Normal. No vaginal discharge or bleeding.     Cervix: Normal. No lesion.     Uterus: Normal. Not enlarged and not tender.      Adnexa: Right adnexa normal and left adnexa normal.  Musculoskeletal:        General: Normal range of motion.     Cervical back: Normal range of motion.  Lymphadenopathy:     Upper Body:     Right upper body: No axillary adenopathy.     Left upper body: No axillary adenopathy.     Lower Body: No right inguinal adenopathy. No left inguinal adenopathy.  Skin:    General: Skin is warm and dry.  Neurological:     General: No focal deficit present.     Mental Status: She is alert.  Psychiatric:        Mood and Affect: Mood normal.        Behavior: Behavior normal.       Assessment and Plan:        Well  woman exam with routine gynecological  exam Assessment & Plan: Cervical cancer screening performed according to ASCCP guidelines. Encouraged annual mammogram screening Colonoscopy UTD DXA N/A Labs and immunizations with her primary Encouraged safe sexual practices as indicated Encouraged healthy lifestyle practices with diet and exercise For patients under 50-70yo, I recommend 1200mg  calcium daily and 600IU of vitamin D daily.    Genitourinary syndrome of menopause Assessment & Plan: Considering applying coconut oil or using daily vaginal moisturizers. Also discussed use f vaginal estrogen and lubricants with intercourse.  Reviewed safety profile of low dose vaginal estrogen, however reviewed that higher doses have been associated with DVT, breast and uterine cancer.    Orders: -     Estradiol; Apply 1/2 gram to vulva nightly for 2 weeks then decrease to 1/2 gram to vulva two nights a week.  Dispense: 42.5 g; Refill: 1  Oral contraceptive pill surveillance -     Norethindrone; Take 1 tablet (0.35 mg total) by mouth daily.  Dispense: 84 tablet; Refill: 4  Yeast infection of the skin -     Nystatin; Apply topically 2 (two) times daily.  Dispense: 60 g; Refill: 2    Rosalyn Gess, MD

## 2022-12-23 NOTE — Assessment & Plan Note (Addendum)
Considering applying coconut oil or using daily vaginal moisturizers. Also discussed use f vaginal estrogen and lubricants with intercourse.  Reviewed safety profile of low dose vaginal estrogen, however reviewed that higher doses have been associated with DVT, breast and uterine cancer.

## 2022-12-29 ENCOUNTER — Encounter: Payer: Self-pay | Admitting: Obstetrics and Gynecology

## 2023-03-10 DIAGNOSIS — E782 Mixed hyperlipidemia: Secondary | ICD-10-CM | POA: Diagnosis not present

## 2023-03-10 DIAGNOSIS — E1169 Type 2 diabetes mellitus with other specified complication: Secondary | ICD-10-CM | POA: Diagnosis not present

## 2023-03-10 DIAGNOSIS — E559 Vitamin D deficiency, unspecified: Secondary | ICD-10-CM | POA: Diagnosis not present

## 2023-03-10 DIAGNOSIS — Z23 Encounter for immunization: Secondary | ICD-10-CM | POA: Diagnosis not present

## 2023-03-10 DIAGNOSIS — I10 Essential (primary) hypertension: Secondary | ICD-10-CM | POA: Diagnosis not present

## 2023-03-10 DIAGNOSIS — J309 Allergic rhinitis, unspecified: Secondary | ICD-10-CM | POA: Diagnosis not present

## 2023-04-09 DIAGNOSIS — L2084 Intrinsic (allergic) eczema: Secondary | ICD-10-CM | POA: Diagnosis not present

## 2023-04-09 DIAGNOSIS — L680 Hirsutism: Secondary | ICD-10-CM | POA: Diagnosis not present

## 2023-04-24 DIAGNOSIS — B880 Other acariasis: Secondary | ICD-10-CM | POA: Diagnosis not present

## 2023-06-07 DIAGNOSIS — S63501A Unspecified sprain of right wrist, initial encounter: Secondary | ICD-10-CM | POA: Diagnosis not present

## 2023-07-09 ENCOUNTER — Other Ambulatory Visit: Payer: Self-pay | Admitting: Obstetrics and Gynecology

## 2023-07-09 DIAGNOSIS — B372 Candidiasis of skin and nail: Secondary | ICD-10-CM

## 2023-07-11 NOTE — Telephone Encounter (Signed)
 Med refill request: nystatin  powder Last AEX: 12/23/22 Next AEX: 12/25/23 Last MMG (if hormonal med) n/a Refill authorized: Please approve or deny as appropriate.

## 2023-08-27 DIAGNOSIS — R112 Nausea with vomiting, unspecified: Secondary | ICD-10-CM | POA: Diagnosis not present

## 2023-09-11 DIAGNOSIS — R112 Nausea with vomiting, unspecified: Secondary | ICD-10-CM | POA: Diagnosis not present

## 2023-09-24 DIAGNOSIS — R1319 Other dysphagia: Secondary | ICD-10-CM | POA: Diagnosis not present

## 2023-09-30 DIAGNOSIS — J309 Allergic rhinitis, unspecified: Secondary | ICD-10-CM | POA: Diagnosis not present

## 2023-09-30 DIAGNOSIS — D72828 Other elevated white blood cell count: Secondary | ICD-10-CM | POA: Diagnosis not present

## 2023-09-30 DIAGNOSIS — E1169 Type 2 diabetes mellitus with other specified complication: Secondary | ICD-10-CM | POA: Diagnosis not present

## 2023-09-30 DIAGNOSIS — I10 Essential (primary) hypertension: Secondary | ICD-10-CM | POA: Diagnosis not present

## 2023-09-30 DIAGNOSIS — Z7985 Long-term (current) use of injectable non-insulin antidiabetic drugs: Secondary | ICD-10-CM | POA: Diagnosis not present

## 2023-09-30 DIAGNOSIS — E782 Mixed hyperlipidemia: Secondary | ICD-10-CM | POA: Diagnosis not present

## 2023-09-30 DIAGNOSIS — Z0000 Encounter for general adult medical examination without abnormal findings: Secondary | ICD-10-CM | POA: Diagnosis not present

## 2023-10-09 DIAGNOSIS — M79671 Pain in right foot: Secondary | ICD-10-CM | POA: Diagnosis not present

## 2023-10-09 DIAGNOSIS — M25561 Pain in right knee: Secondary | ICD-10-CM | POA: Diagnosis not present

## 2023-11-03 DIAGNOSIS — I10 Essential (primary) hypertension: Secondary | ICD-10-CM | POA: Diagnosis not present

## 2023-11-03 DIAGNOSIS — E1169 Type 2 diabetes mellitus with other specified complication: Secondary | ICD-10-CM | POA: Diagnosis not present

## 2023-11-03 DIAGNOSIS — Z658 Other specified problems related to psychosocial circumstances: Secondary | ICD-10-CM | POA: Diagnosis not present

## 2023-11-05 DIAGNOSIS — M25561 Pain in right knee: Secondary | ICD-10-CM | POA: Diagnosis not present

## 2023-11-11 DIAGNOSIS — M25561 Pain in right knee: Secondary | ICD-10-CM | POA: Diagnosis not present

## 2023-11-17 ENCOUNTER — Other Ambulatory Visit: Payer: Self-pay | Admitting: Family Medicine

## 2023-11-17 DIAGNOSIS — Z1231 Encounter for screening mammogram for malignant neoplasm of breast: Secondary | ICD-10-CM

## 2023-11-26 DIAGNOSIS — M25561 Pain in right knee: Secondary | ICD-10-CM | POA: Diagnosis not present

## 2023-12-10 DIAGNOSIS — M25561 Pain in right knee: Secondary | ICD-10-CM | POA: Diagnosis not present

## 2023-12-17 DIAGNOSIS — M25561 Pain in right knee: Secondary | ICD-10-CM | POA: Diagnosis not present

## 2023-12-25 ENCOUNTER — Encounter: Payer: Self-pay | Admitting: Obstetrics and Gynecology

## 2023-12-25 ENCOUNTER — Ambulatory Visit: Payer: Federal, State, Local not specified - PPO | Admitting: Obstetrics and Gynecology

## 2023-12-25 VITALS — BP 124/78 | HR 81 | Temp 98.0°F | Ht 64.0 in | Wt 202.0 lb

## 2023-12-25 DIAGNOSIS — N958 Other specified menopausal and perimenopausal disorders: Secondary | ICD-10-CM | POA: Diagnosis not present

## 2023-12-25 DIAGNOSIS — B372 Candidiasis of skin and nail: Secondary | ICD-10-CM | POA: Diagnosis not present

## 2023-12-25 DIAGNOSIS — Z1331 Encounter for screening for depression: Secondary | ICD-10-CM

## 2023-12-25 DIAGNOSIS — N898 Other specified noninflammatory disorders of vagina: Secondary | ICD-10-CM | POA: Diagnosis not present

## 2023-12-25 DIAGNOSIS — M25561 Pain in right knee: Secondary | ICD-10-CM | POA: Diagnosis not present

## 2023-12-25 DIAGNOSIS — Z01419 Encounter for gynecological examination (general) (routine) without abnormal findings: Secondary | ICD-10-CM | POA: Diagnosis not present

## 2023-12-25 DIAGNOSIS — Z3041 Encounter for surveillance of contraceptive pills: Secondary | ICD-10-CM

## 2023-12-25 LAB — WET PREP FOR TRICH, YEAST, CLUE

## 2023-12-25 MED ORDER — NYSTATIN 100000 UNIT/GM EX POWD: Freq: Two times a day (BID) | CUTANEOUS | 3 refills | Status: AC | PRN

## 2023-12-25 MED ORDER — ESTRADIOL 0.01 % VA CREA: TOPICAL_CREAM | VAGINAL | 1 refills | Status: AC

## 2023-12-25 MED ORDER — NORETHINDRONE 0.35 MG PO TABS: 1.0000 | ORAL_TABLET | Freq: Every day | ORAL | 4 refills | Status: AC

## 2023-12-25 NOTE — Progress Notes (Signed)
 53 y.o. G0P0000 female on POP with history of fibroids (s/p RA-myomectomy, 2014) and history of breast reduction here for annual exam. Single, engaged. 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 PCP: Claudene Pellet, MD   Patient's last menstrual period was 10/18/2017. Menarche 53yo  She has a skin tag along vaginal that is uncomfortable. She is wondering when she can stop POP. No cycles since starting the pill. Ongoing discomfort for yeast along abdominal folds. On ozempic for weight loss.  Abnormal bleeding: none Pelvic discharge or pain: none Breast mass, nipple discharge or skin changes : none Birth control: POP  Last PAP:     Component Value Date/Time   DIAGPAP  11/22/2021 0830    - Negative for intraepithelial lesion or malignancy (NILM)   ADEQPAP  11/22/2021 0830    Satisfactory for evaluation; transformation zone component PRESENT.   Last mammogram: 12/18/22 Bi-Rads 1, Density B, scheduled; Gail risk 2% (8yr), 13% (lifetime); given intermediate lifetime risk okay to continue MMG Last colonoscopy: ~6 years ago per patient, Dr. Renaye Sous   Sexually active: yes Exercising: no Smoking: no  Flowsheet Row Office Visit from 12/25/2023 in [facility]  PHQ-2 Total Score 0   Flowsheet Row Office Visit from 12/23/2022 in [facility]  PHQ-9 Total Score 0    GYN HISTORY: Sister dx' with breast cancer at 38yo- negative genetic testing Lumpectomy x1, benign  OB History  Gravida Para Term Preterm AB Living  0 0 0 0 0 0  SAB IAB Ectopic Multiple Live Births  0 0 0 0 0    Past Medical History:  Diagnosis Date   Diabetes mellitus without complication (HCC)    diet control   GERD (gastroesophageal reflux disease)    Hypertension     Past Surgical History:  Procedure Laterality Date   BREAST BIOPSY     BREAST EXCISIONAL BIOPSY Left    BREAST SURGERY  2001   reduction   CHOLECYSTECTOMY  '90's   LAPAROSCOPIC GASTRIC BANDING   2008   REDUCTION MAMMAPLASTY Bilateral    ROBOT ASSISTED MYOMECTOMY  01/17/2012   Procedure: ROBOTIC ASSISTED MYOMECTOMY;  Surgeon: Percilla Burly, MD;  Location: [facility];  Service: Gynecology;  Laterality: N/A;    Current Outpatient Medications on File Prior to Visit  Medication Sig Dispense Refill   amLODipine (NORVASC) 5 MG tablet Take 5 mg by mouth daily.     cetirizine (ZYRTEC) 10 MG tablet Take 10 mg by mouth.     CVS D3 5000 units capsule Take 5,000 Units by mouth daily.  1   EPINEPHrine 0.3 mg/0.3 mL IJ SOAJ injection      losartan (COZAAR) 100 MG tablet losartan 100 mg tablet     OZEMPIC, 1 MG/DOSE, 4 MG/3ML SOPN Inject 1 mg into the skin once a week.     Docusate Sodium (DSS) 250 MG CAPS 1 capsule.     No current facility-administered medications on file prior to visit.    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[employer]  Tobacco Use   Smoking status: Never   Smokeless tobacco: Never  Vaping Use   Vaping status: Never Used  Substance and Sexual Activity   Alcohol use: Yes    Comment:  occasionally   Drug use: No   Sexual activity: Yes    Partners: Male    Birth control/protection: Pill, Post-menopausal    Comment: 1st intercourse- 17, partners- 4,  Other Topics Concern   Not on file  Social History Narrative   Not on file   Social Drivers of Health   Tobacco Use: Low Risk (12/25/2023)   Patient History    Smoking Tobacco Use: Never    Smokeless Tobacco Use: Never    Passive Exposure: Not on file  Financial Resource Strain: Not on file  Food Insecurity: Not on file  Transportation Needs: Not on file  Physical Activity: Not on file  Stress: Not on file  Social Connections: Not on file  Intimate Partner Violence: Not on file  Depression (PHQ2-9): Low Risk  (12/25/2023)   Depression (PHQ2-9)    PHQ-2 Score: 0  Alcohol Screen: Not on file  Housing: Not on file  Utilities: Not on file  Health Literacy: Not on file    Family History  Problem Relation Age of Onset   Cancer Sister 52       breast ca, neg testing   Diabetes Sister    Breast cancer Sister 76   Cancer Maternal Uncle        throat ca   Cancer Paternal Aunt        stomach ca   Diabetes Paternal Aunt    Cancer Paternal Uncle        colon ca   Diabetes Paternal Uncle    Cancer Cousin        stomach ca   Cancer Paternal Aunt        breast ca   Diabetes Paternal Aunt    Diabetes Father    Breast cancer Maternal Aunt    Stroke Maternal Grandmother    Cancer Maternal Uncle     Allergies  Allergen Reactions   Glucophage [Metformin]     Other reaction(s): upset stomach   Lisinopril     Other reaction(s): cough present   Nickel     Other reaction(s): Unknown      PE Today's Vitals   12/25/23 0806  BP: 124/78  Pulse: 81  Temp: 98 F (36.7 C)  TempSrc: Oral  SpO2: 98%  Weight: 202 lb (91.6 kg)  Height: 5' 4 (1.626 m)   Body mass index is 34.67 kg/m.  Physical Exam Vitals reviewed. Exam conducted with a chaperone present.  Constitutional:      General: She is not in acute distress.    Appearance: Normal appearance.  HENT:     Head: Normocephalic and atraumatic.     Nose: Nose normal.  Eyes:     Extraocular Movements: Extraocular movements intact.     Conjunctiva/sclera: Conjunctivae normal.  Pulmonary:     Effort: Pulmonary effort is normal.  Chest:     Chest wall: No mass or tenderness.  Breasts:    Right: Normal. No swelling, mass, nipple discharge, skin change or tenderness.     Left: Normal. No swelling, mass, nipple discharge, skin change or tenderness.       Comments: Bilateral mammoplasty scars Prior lumpectomy site, benign Abdominal:     General: There is no distension.     Palpations: Abdomen is soft.     Tenderness: There is no  abdominal tenderness.  Genitourinary:    General: Normal vulva.     Exam position: Lithotomy position.     Urethra: No prolapse.     Vagina:  Vaginal discharge present. No bleeding.     Cervix: Normal. No lesion.     Uterus: Normal. Not enlarged and not tender.      Adnexa: Right adnexa normal and left adnexa normal.   Musculoskeletal:        General: Normal range of motion.  Lymphadenopathy:     Upper Body:     Right upper body: No axillary adenopathy.     Left upper body: No axillary adenopathy.     Lower Body: No right inguinal adenopathy. No left inguinal adenopathy.  Skin:    General: Skin is warm and dry.  Neurological:     General: No focal deficit present.     Mental Status: She is alert.  Psychiatric:        Mood and Affect: Mood normal.        Behavior: Behavior normal.      Assessment and Plan:        Well woman exam with routine gynecological exam Assessment & Plan: Cervical cancer screening performed according to ASCCP guidelines. Encouraged annual mammogram screening Colonoscopy UTD DXA N/A Labs and immunizations with her primary Encouraged safe sexual practices as indicated Encouraged healthy lifestyle practices with diet and exercise For patients under 50-70yo, I recommend 1200mg  calcium daily and 600IU of vitamin D daily.  Discussed exp management vs excision of vulvar skin tag- will call if she wants to proceed  Genitourinary syndrome of menopause -     Estradiol ; Apply 0.5g to vulva nightly for 2 weeks then 2 times a week. Do not use applicator.  Dispense: 42.5 g; Refill: 1  Oral contraceptive pill surveillance -     Follicle stimulating hormone -     Norethindrone ; Take 1 tablet (0.35 mg total) by mouth daily.  Dispense: 84 tablet; Refill: 4  Vaginal discharge -     WET PREP FOR TRICH, YEAST, CLUE  Yeast infection of the skin -     Nystatin ; Apply topically 2 (two) times daily as needed.  Dispense: 60 g; Refill: 3  Negative depression  screening   Vera LULLA Pa, MD

## 2023-12-25 NOTE — Patient Instructions (Signed)

## 2023-12-25 NOTE — Assessment & Plan Note (Signed)
 Cervical cancer screening performed according to ASCCP guidelines. Encouraged annual mammogram screening Colonoscopy UTD DXA N/A Labs and immunizations with her primary Encouraged safe sexual practices as indicated Encouraged healthy lifestyle practices with diet and exercise For patients under 50-52yo, I recommend 1200mg  calcium daily and 600IU of vitamin D daily.

## 2023-12-26 ENCOUNTER — Ambulatory Visit
Admission: RE | Admit: 2023-12-26 | Discharge: 2023-12-26 | Disposition: A | Source: Ambulatory Visit | Attending: Family Medicine | Admitting: Family Medicine

## 2023-12-26 DIAGNOSIS — Z1231 Encounter for screening mammogram for malignant neoplasm of breast: Secondary | ICD-10-CM

## 2024-01-13 DIAGNOSIS — G8929 Other chronic pain: Secondary | ICD-10-CM | POA: Diagnosis not present

## 2024-01-13 DIAGNOSIS — M25571 Pain in right ankle and joints of right foot: Secondary | ICD-10-CM | POA: Diagnosis not present

## 2024-01-13 DIAGNOSIS — M25561 Pain in right knee: Secondary | ICD-10-CM | POA: Diagnosis not present

## 2024-12-28 ENCOUNTER — Ambulatory Visit: Admitting: Obstetrics and Gynecology
